# Patient Record
Sex: Female | Born: 1975 | Race: White | Hispanic: No | Marital: Married | Smoking: Never smoker
Health system: Southern US, Community
[De-identification: ages and names within clinical notes are randomized; demographics above are authoritative.]

## PROBLEM LIST (undated history)

## (undated) DIAGNOSIS — E559 Vitamin D deficiency, unspecified: Secondary | ICD-10-CM

## (undated) HISTORY — DX: Vitamin D deficiency, unspecified: E55.9

## (undated) HISTORY — PX: NO PAST SURGERIES: SHX2092

---

## 1997-11-20 ENCOUNTER — Emergency Department (HOSPITAL_COMMUNITY): Admission: EM | Admit: 1997-11-20 | Discharge: 1997-11-20 | Payer: Self-pay | Admitting: Emergency Medicine

## 1997-12-21 ENCOUNTER — Other Ambulatory Visit: Admission: RE | Admit: 1997-12-21 | Discharge: 1997-12-21 | Payer: Self-pay | Admitting: Obstetrics and Gynecology

## 2004-01-05 ENCOUNTER — Other Ambulatory Visit: Admission: RE | Admit: 2004-01-05 | Discharge: 2004-01-05 | Payer: Self-pay | Admitting: Obstetrics and Gynecology

## 2005-01-04 ENCOUNTER — Other Ambulatory Visit: Admission: RE | Admit: 2005-01-04 | Discharge: 2005-01-04 | Payer: Self-pay | Admitting: Obstetrics and Gynecology

## 2005-11-20 ENCOUNTER — Other Ambulatory Visit: Admission: RE | Admit: 2005-11-20 | Discharge: 2005-11-20 | Payer: Self-pay | Admitting: Obstetrics and Gynecology

## 2005-12-17 ENCOUNTER — Encounter: Admission: RE | Admit: 2005-12-17 | Discharge: 2006-01-03 | Payer: Self-pay | Admitting: Internal Medicine

## 2006-06-13 ENCOUNTER — Inpatient Hospital Stay (HOSPITAL_COMMUNITY): Admission: AD | Admit: 2006-06-13 | Discharge: 2006-06-14 | Payer: Self-pay | Admitting: Obstetrics and Gynecology

## 2006-06-14 ENCOUNTER — Inpatient Hospital Stay (HOSPITAL_COMMUNITY): Admission: AD | Admit: 2006-06-14 | Discharge: 2006-06-16 | Payer: Self-pay | Admitting: Obstetrics and Gynecology

## 2007-07-24 ENCOUNTER — Other Ambulatory Visit: Admission: RE | Admit: 2007-07-24 | Discharge: 2007-07-24 | Payer: Self-pay | Admitting: Nurse Practitioner

## 2008-06-23 ENCOUNTER — Inpatient Hospital Stay (HOSPITAL_COMMUNITY): Admission: AD | Admit: 2008-06-23 | Discharge: 2008-06-25 | Payer: Self-pay | Admitting: Obstetrics and Gynecology

## 2008-11-17 ENCOUNTER — Other Ambulatory Visit: Admission: RE | Admit: 2008-11-17 | Discharge: 2008-11-17 | Payer: Self-pay | Admitting: Obstetrics and Gynecology

## 2010-07-20 LAB — CBC
HCT: 31.4 % — ABNORMAL LOW (ref 36.0–46.0)
Hemoglobin: 10.9 g/dL — ABNORMAL LOW (ref 12.0–15.0)
MCHC: 34.1 g/dL (ref 30.0–36.0)
MCV: 89.7 fL (ref 78.0–100.0)
Platelets: 220 10*3/uL (ref 150–400)
RBC: 3.44 MIL/uL — ABNORMAL LOW (ref 3.87–5.11)
WBC: 12.2 10*3/uL — ABNORMAL HIGH (ref 4.0–10.5)
WBC: 12.4 10*3/uL — ABNORMAL HIGH (ref 4.0–10.5)

## 2010-07-20 LAB — URIC ACID: Uric Acid, Serum: 3.2 mg/dL (ref 2.4–7.0)

## 2010-07-20 LAB — COMPREHENSIVE METABOLIC PANEL
AST: 21 U/L (ref 0–37)
Albumin: 2.9 g/dL — ABNORMAL LOW (ref 3.5–5.2)
Calcium: 8.9 mg/dL (ref 8.4–10.5)
Chloride: 105 mEq/L (ref 96–112)
Creatinine, Ser: 0.65 mg/dL (ref 0.4–1.2)
GFR calc Af Amer: >60 mL/min (ref >60)

## 2013-02-23 ENCOUNTER — Encounter: Payer: Self-pay | Admitting: Internal Medicine

## 2013-02-24 ENCOUNTER — Ambulatory Visit: Payer: Self-pay | Admitting: Internal Medicine

## 2013-02-24 ENCOUNTER — Encounter: Payer: Self-pay | Admitting: Internal Medicine

## 2013-02-24 VITALS — BP 116/68 | HR 76 | Temp 99.1°F | Resp 16 | Ht 68.0 in | Wt 162.2 lb

## 2013-02-24 DIAGNOSIS — J069 Acute upper respiratory infection, unspecified: Secondary | ICD-10-CM

## 2013-02-24 MED ORDER — PREDNISONE 20 MG PO TABS: 20.0000 mg | ORAL_TABLET | ORAL | Status: DC

## 2013-02-24 NOTE — Progress Notes (Signed)
Patient ID: Abigail Hicks, female   DOB: Jun 08, 1975, 37 y.o.   MRN: [id number]  HPI  TYhis very nice 37 yo MWF  presents with URI symptoms for about 1 week. . Patient has has had fever of about 1 degree. She reports some fullness in her ears and head and a sensationof feeling 'spaced out'. Her Sx's are skewed by the recent transition from citalopram to wellbutrin (because of weight gain) with consideration of possible serotonin withdrawal sx's. She denies any nausea, vertigo or neurologic Sx's other than a mild headache.    Past Medical History  Diagnosis Date  . Vitamin D deficiency     Filed Vitals:   02/24/13 1437  BP: 116/68  Pulse: 76  Temp: 99.1 F (37.3 C)  Resp: 16    General Appearance: Patient in no distress.  Eyes: PERRLA, EOMS, conjunctiva pink without drainage or nodules.  Sinuses: Nontender maxillary and frontal sinus tenderness.  ENT/Mouth: EACs patent, no swelling, erythema, drainage. TMs sl retracted. Nasal mucosa/turbinates nl. Dentition- good hygiene. Post pharynx without erythema, swelling or exudate. Tonsils without swelling, erythema or exudate.  Neck: Supple. No B,N,JVD.  Respiratory: BS equal and clear.  Carviovascular: RRR, no murmurs, rubs or gallops.  Assessment and plan  Visit Diagnoses   Acute upper respiratory infections of unspecified site    -  Primary    Relevant Medications       predniSONE (DELTASONE) tablet - recommended restart citalopram tim net OV scheduledfor about 2 weeks

## 2013-02-24 NOTE — Patient Instructions (Signed)
  Restart your citalopram 40 mg daily for 1 week and then if your dizziness is better, try cutting your citalopram back to 1/2 tablet daily    Dizziness Dizziness is a common problem. It is a feeling of unsteadiness or lightheadedness. You may feel like you are about to faint. Dizziness can lead to injury if you stumble or fall. A person of any age group can suffer from dizziness, but dizziness is more common in older adults. CAUSES  Dizziness can be caused by many different things, including:  Middle ear problems.  Standing for too long.  Infections.  An allergic reaction.  Aging.  An emotional response to something, such as the sight of blood.  Side effects of medicines.  Fatigue.  Problems with circulation or blood pressure.  Excess use of alcohol, medicines, or illegal drug use.  Breathing too fast (hyperventilation).  An arrhythmia or problems with your heart rhythm.  Low red blood cell count (anemia).  Pregnancy.  Vomiting, diarrhea, fever, or other illnesses that cause dehydration.  Diseases or conditions such as Parkinson's disease, high blood pressure (hypertension), diabetes, and thyroid problems.  Exposure to extreme heat. DIAGNOSIS  To find the cause of your dizziness, your caregiver may do a physical exam, lab tests, radiologic imaging scans, or an electrocardiography test (ECG).  TREATMENT  Treatment of dizziness depends on the cause of your symptoms and can vary greatly. HOME CARE INSTRUCTIONS   Drink enough fluids to keep your urine clear or pale yellow. This is especially important in very hot weather. In the elderly, it is also important in cold weather.  If your dizziness is caused by medicines, take them exactly as directed. When taking blood pressure medicines, it is especially important to get up slowly.  Rise slowly from chairs and steady yourself until you feel okay.  In the morning, first sit up on the side of the bed. When this seems  okay, stand slowly while holding onto something until you know your balance is fine.  If you need to stand in one place for a long time, be sure to move your legs often. Tighten and relax the muscles in your legs while standing.  If dizziness continues to be a problem, have someone stay with you for a day or two. Do this until you feel you are well enough to stay alone. Have the person call your caregiver if he or she notices changes in you that are concerning.  Do not drive or use heavy machinery if you feel dizzy.  Do not drink alcohol. SEEK IMMEDIATE MEDICAL CARE IF:   Your dizziness or lightheadedness gets worse.  You feel nauseous or vomit.  You develop problems with talking, walking, weakness, or using your arms, hands, or legs.  You are not thinking clearly or you have difficulty forming sentences. It may take a friend or family member to determine if your thinking is normal.  You develop chest pain, abdominal pain, shortness of breath, or sweating.  Your vision changes.  You notice any bleeding.  You have side effects from medicine that seems to be getting worse rather than better. MAKE SURE YOU:   Understand these instructions.  Will watch your condition.  Will get help right away if you are not doing well or get worse. Document Released: 09/19/2000 Document Revised: 06/18/2011 Document Reviewed: 10/13/2010 [facility] Patient Information 2014 [place], [place].

## 2013-02-26 ENCOUNTER — Telehealth: Payer: Self-pay | Admitting: Internal Medicine

## 2013-02-26 ENCOUNTER — Other Ambulatory Visit: Payer: Self-pay | Admitting: Internal Medicine

## 2013-02-26 DIAGNOSIS — F411 Generalized anxiety disorder: Secondary | ICD-10-CM

## 2013-02-26 MED ORDER — CITALOPRAM HYDROBROMIDE 10 MG PO TABS: 10.0000 mg | ORAL_TABLET | Freq: Every day | ORAL | Status: DC

## 2013-02-26 NOTE — Telephone Encounter (Signed)
Pt called said you told her at office visit to continue the Citalapram 10mg  QD. Her rx expires and needs a new one for just 10 days. Please advise  Pharm: [facility]  Thanks [place]

## 2013-03-12 ENCOUNTER — Ambulatory Visit: Payer: Self-pay | Admitting: Internal Medicine

## 2013-04-08 ENCOUNTER — Ambulatory Visit: Payer: Self-pay | Admitting: Internal Medicine

## 2013-10-08 ENCOUNTER — Encounter: Payer: Self-pay | Admitting: Physician Assistant

## 2013-10-08 ENCOUNTER — Ambulatory Visit (INDEPENDENT_AMBULATORY_CARE_PROVIDER_SITE_OTHER): Payer: BC Managed Care – PPO | Admitting: Physician Assistant

## 2013-10-08 VITALS — BP 100/62 | HR 72 | Temp 98.1°F | Resp 16 | Wt 159.0 lb

## 2013-10-08 DIAGNOSIS — L259 Unspecified contact dermatitis, unspecified cause: Secondary | ICD-10-CM

## 2013-10-08 DIAGNOSIS — L309 Dermatitis, unspecified: Secondary | ICD-10-CM

## 2013-10-08 MED ORDER — PREDNISONE 20 MG PO TABS: ORAL_TABLET | ORAL | Status: DC

## 2013-10-08 MED ORDER — DEXAMETHASONE SODIUM PHOSPHATE 10 MG/ML IJ SOLN
10.0000 mg | Freq: Once | INTRAMUSCULAR | Status: AC
  Administered 2013-10-08: 10 mg via INTRAMUSCULAR

## 2013-10-08 NOTE — Progress Notes (Signed)
   Subjective:    Patient ID: Abigail Hicks, female    DOB: Oct 07, 1975, 38 y.o.   MRN: [id number]  HPI 38 y.o. with history of sensitive skin went on recent trip to [place], states second day in [place] on right lower leg started to have small papules but then it spread to bilateral legs. Has been putting hydrocortisone on it and has been doing benadryl. Kids and husband also have virus, she denies sore throat, fever, chills, SOB, CP. She is off the citalopram/wellbutrin.    Review of Systems  Constitutional: Negative.   HENT: Negative.   Respiratory: Negative.   Cardiovascular: Negative.   Gastrointestinal: Negative.   Genitourinary: Negative.   Musculoskeletal: Negative.   Skin: Positive for rash (on bilateral legs).  Neurological: Negative.        Objective:   Physical Exam  Constitutional: She is oriented to person, place, and time. She appears well-developed and well-nourished.  HENT:  Head: Normocephalic and atraumatic.  Mouth/Throat: Oropharynx is clear and moist. No oropharyngeal exudate.  Eyes: Conjunctivae are normal. Pupils are equal, round, and reactive to light.  Neck: Normal range of motion. Neck supple.  Cardiovascular: Normal rate and regular rhythm.   Pulmonary/Chest: Effort normal and breath sounds normal.  Abdominal: Soft. Bowel sounds are normal. There is no tenderness.  Musculoskeletal: Normal range of motion.  Lymphadenopathy:    She has no cervical adenopathy.  Neurological: She is alert and oriented to person, place, and time.  Skin: Skin is warm and dry. Rash (fine papular rash on bilateral legs) noted.      Assessment & Plan:  1. Dermatitis Vs Viral exantham Continue benadryl at night - dexamethasone (DECADRON) injection 10 mg; Inject 1 mL (10 mg total) into the muscle once. - predniSONE (DELTASONE) 20 MG tablet; Take one pill two times daily for 3 days, take one pill daily for 4 days.  Dispense: 10 tablet; Refill: 1

## 2013-10-08 NOTE — Patient Instructions (Signed)

## 2014-01-17 ENCOUNTER — Encounter: Payer: Self-pay | Admitting: Internal Medicine

## 2014-01-17 DIAGNOSIS — E559 Vitamin D deficiency, unspecified: Secondary | ICD-10-CM | POA: Insufficient documentation

## 2014-01-17 DIAGNOSIS — Z79899 Other long term (current) drug therapy: Secondary | ICD-10-CM | POA: Insufficient documentation

## 2014-01-17 DIAGNOSIS — R7309 Other abnormal glucose: Secondary | ICD-10-CM | POA: Insufficient documentation

## 2014-01-17 NOTE — Progress Notes (Signed)
Patient ID: Abigail Hicks, female   DOB: 11/27/1975, 38 y.o.   MRN: [id number]   Annual Screening Comprehensive Examination   This very nice 38 y.o.MWF presents for complete physical.  Patient has no major health issues.    Finally, patient has history of Vitamin D Deficiency of 33 in 2011 and last vitamin D was 108 in Oct 2014 and patient was advised to taper her dose of Vit D.   Current Outpatient Prescriptions on File Prior to Visit  Medication Sig Dispense Refill  . VITAMIN D Take 8,000 Units  daily.      . Multiple Vitamin (MULTIVITAMIN) tablet Take 1 tablet by mouth daily.       Allergies  Allergen Reactions  . Pneumovax [Pneumococcal Polysaccharide Vaccine]    Past Medical History  Diagnosis Date  . Vitamin D deficiency    Immunization History  Administered Date(s) Administered  . Influenza Split 01/18/2014  . Influenza-Unspecified 01/12/2013  . PPD Test 01/18/2014  . Pneumococcal Polysaccharide-23 01/03/2011  . Td 12/28/2009   History reviewed. No pertinent past surgical history.  Family History  Problem Relation Age of Onset  . Arthritis Mother     Rhumatoid  . Multiple myeloma Father    History   Social History  . Marital Status: Married    Spouse Name: N/A    Number of Children:  Son 46 yo & Daughter 83 yo.   Occupational History  . 3rd grade teacher   Social History Main Topics  . Smoking status: Never Smoker   . Smokeless tobacco: Not on file  . Alcohol Use: 3.5 oz/week    7 drink(s) per week  . Drug Use: No  . Sexual Activity: Active     ROS Constitutional: Denies fever, chills, weight loss/gain, headaches, insomnia, fatigue, night sweats, and change in appetite. Eyes: Denies redness, blurred vision, diplopia, discharge, itchy, watery eyes.  ENT: Denies discharge, congestion, post nasal drip, epistaxis, sore throat, earache, hearing loss, dental pain, Tinnitus, Vertigo, Sinus pain, snoring.  Cardio: Denies chest pain, palpitations, irregular  heartbeat, syncope, dyspnea, diaphoresis, orthopnea, PND, claudication, edema Respiratory: denies cough, dyspnea, DOE, pleurisy, hoarseness, laryngitis, wheezing.  Gastrointestinal: Denies dysphagia, heartburn, reflux, water brash, pain, cramps, nausea, vomiting, bloating, diarrhea, constipation, hematemesis, melena, hematochezia, jaundice, hemorrhoids Genitourinary: Denies dysuria, frequency, urgency, nocturia, hesitancy, discharge, hematuria, flank pain Breast: Breast lumps, nipple discharge, bleeding.  Musculoskeletal: Denies arthralgia, myalgia, stiffness, Jt. Swelling, pain, limp, and strain/sprain. Skin: Denies puritis, rash, hives, warts, acne, eczema, changing in skin lesion Neuro: Weakness, tremor, incoordination, spasms, paresthesia, pain Psychiatric: Denies confusion, memory loss, sensory loss. Endocrine: Denies change in weight, skin, hair change, nocturia, and paresthesia, diabetic polys, visual blurring, hyper /hypo glycemic episodes.  Heme/Lymph: No excessive bleeding, bruising, enlarged lymph nodes.  Physical Exam  BP 112/74  Pulse 84  Temp 98.4 F Resp 16  Ht $R'5\' 8"'FY$    Wt 165 lb 3.2 oz   BMI 25.12   General Appearance: Well nourished and in no apparent distress. Eyes: PERRLA, EOMs, conjunctiva no swelling or erythema, normal fundi and vessels. Sinuses: No frontal/maxillary tenderness ENT/Mouth: EACs patent / TMs  nl. Nares clear without erythema, swelling, mucoid exudates. Oral hygiene is good. No erythema, swelling, or exudate. Tongue normal, non-obstructing. Tonsils not swollen or erythematous. Hearing normal.  Neck: Supple, thyroid normal. No bruits, nodes or JVD. Respiratory: Respiratory effort normal.  BS equal and clear bilateral without rales, rhonci, wheezing or stridor. Cardio: Heart sounds are normal with regular rate and rhythm and  no murmurs, rubs or gallops. Peripheral pulses are normal and equal bilaterally without edema. No aortic or femoral bruits. Chest:  symmetric with normal excursions and percussion. Breasts: Done recent by GYN / PAP & MGM also done by GYN Abdomen: Flat, soft, with bowl sounds. Nontender, no guarding, rebound, hernias, masses, or organomegaly.  Lymphatics: Non tender without lymphadenopathy.  Musculoskeletal: Full ROM all peripheral extremities, joint stability, 5/5 strength, and normal gait. Skin: Warm and dry without rashes, lesions, cyanosis, clubbing or  ecchymosis.  Neuro: Cranial nerves intact, reflexes equal bilaterally. Normal muscle tone, no cerebellar symptoms. Sensation intact.  Pysch: Awake and oriented X 3, normal affect, Insight and Judgment appropriate.   Assessment and Plan  1. Annual Screening Examination 2.  Vitamin D Deficiency  Continue prudent diet as discussed, weight control, regular exercise, and medications. Routine screening labs and tests as requested with regular follow-up as recommended.

## 2014-01-17 NOTE — Patient Instructions (Signed)
Recommend the book "The END of DIETING" by Dr Baker Janus   and the book "The END of DIABETES " by Dr Excell Seltzer  At [email or web address] - get book & Audio CD's      Being diabetic has a  300% increased risk for heart attack, stroke, cancer, and alzheimer- type vascular dementia. It is very important that you work harder with diet by avoiding all foods that are white except chicken & fish. Avoid white rice (brown & wild rice is OK), white potatoes (sweetpotatoes in moderation is OK), White bread or wheat bread or anything made out of white flour like bagels, donuts, rolls, buns, biscuits, cakes, pastries, cookies, pizza crust, and pasta (made from white flour & egg whites) - vegetarian pasta or spinach or wheat pasta is OK. Multigrain breads like Arnold's or [employer], or multigrain sandwich thins or flatbreads.  Diet, exercise and weight loss can reverse and cure diabetes in the early stages.  Diet, exercise and weight loss is very important in the control and prevention of complications of diabetes which affects every system in your body, ie. Brain - dementia/stroke, eyes - glaucoma/blindness, heart - heart attack/heart failure, kidneys - dialysis, stomach - gastric paralysis, intestines - malabsorption, nerves - severe painful neuritis, circulation - gangrene & loss of a leg(s), and finally cancer and Alzheimers.    I recommend avoid fried & greasy foods,  sweets/candy, white rice (brown or wild rice or Quinoa is OK), white potatoes (sweet potatoes are OK) - anything made from white flour - bagels, doughnuts, rolls, buns, biscuits,white and wheat breads, pizza crust and traditional pasta made of white flour & egg white(vegetarian pasta or spinach or wheat pasta is OK).  Multi-grain bread is OK - like multi-grain flat bread or sandwich thins. Avoid alcohol in excess. Exercise is also important.    Eat all the vegetables you want - avoid meat, especially red meat and dairy - especially cheese.  Cheese  is the most concentrated form of trans-fats which is the worst thing to clog up our arteries. Veggie cheese is OK which can be found in the fresh produce section at [employer] or [employer] or [employer]  Preventive Care for Adults A healthy lifestyle and preventive care can promote health and wellness. Preventive health guidelines for women include the following key practices.  A routine yearly physical is a good way to check with your health care provider about your health and preventive screening. It is a chance to share any concerns and updates on your health and to receive a thorough exam.  Visit your dentist for a routine exam and preventive care every 6 months. Brush your teeth twice a day and floss once a day. Good oral hygiene prevents tooth decay and gum disease.  The frequency of eye exams is based on your age, health, family medical history, use of contact lenses, and other factors. Follow your health care provider's recommendations for frequency of eye exams.  Eat a healthy diet. Foods like vegetables, fruits, whole grains, low-fat dairy products, and lean protein foods contain the nutrients you need without too many calories. Decrease your intake of foods high in solid fats, added sugars, and salt. Eat the right amount of calories for you.Get information about a proper diet from your health care provider, if necessary.  Regular physical exercise is one of the most important things you can do for your health. Most adults should get at least 150 minutes of moderate-intensity exercise (any activity that increases  your heart rate and causes you to sweat) each week. In addition, most adults need muscle-strengthening exercises on 2 or more days a week.  Maintain a healthy weight. The body mass index (BMI) is a screening tool to identify possible weight problems. It provides an estimate of body fat based on height and weight. Your health care provider can find your BMI and can help you  achieve or maintain a healthy weight.For adults 20 years and older:  A BMI below 18.5 is considered underweight.  A BMI of 18.5 to 24.9 is normal.  A BMI of 25 to 29.9 is considered overweight.  A BMI of 30 and above is considered obese.  Maintain normal blood lipids and cholesterol levels by exercising and minimizing your intake of saturated fat. Eat a balanced diet with plenty of fruit and vegetables.Ongoing high lipid and cholesterol levels should be treated with medicines if diet and exercise are not working.  If you smoke, find out from your health care provider how to quit. If you do not use tobacco, do not start.  Lung cancer screening is recommended for adults aged 62-80 years who are at high risk for developing lung cancer because of a history of smoking. A yearly low-dose CT scan of the lungs is recommended for people who have at least a 30-pack-year history of smoking and are a current smoker or have quit within the past 15 years. A pack year of smoking is smoking an average of 1 pack of cigarettes a day for 1 year (for example: 1 pack a day for 30 years or 2 packs a day for 15 years). Yearly screening should continue until the smoker has stopped smoking for at least 15 years. Yearly screening should be stopped for people who develop a health problem that would prevent them from having lung cancer treatment.  If you are pregnant, do not drink alcohol. If you are breastfeeding, be very cautious about drinking alcohol. If you are not pregnant and choose to drink alcohol, do not have more than 1 drink per day. One drink is considered to be 12 ounces (355 mL) of beer, 5 ounces (148 mL) of wine, or 1.5 ounces (44 mL) of liquor.  Avoid use of street drugs. Do not share needles with anyone. Ask for help if you need support or instructions about stopping the use of drugs.  High blood pressure causes heart disease and increases the risk of stroke.  Ongoing high blood pressure should be  treated with medicines if weight loss and exercise do not work.  IAsk your health care provider if you should take aspirin to prevent strokes.  Diabetes screening involves taking a blood sample to check your fasting blood sugar level.  Testing should be considered at a younger age or be carried out more frequently if you are overweight and have at least 1 risk factor for diabetes.    Breast cancer screening is essential preventive care for women. You should practice "breast self-awareness." This means understanding the normal appearance and feel of your breasts and may include breast self-examination. Any changes detected, no matter how small, should be reported to a health care provider. Women in their 77s and 30s should have a clinical breast exam (CBE) by a health care provider as part of a regular health exam every 1 to 3 years. After age 72, women should have a CBE every year. Starting at age 56, women should consider having a mammogram (breast X-ray test) every year. Women who have  a family history of breast cancer should talk to their health care provider about genetic screening. Women at a high risk of breast cancer should talk to their health care providers about having an MRI and a mammogram every year.  Breast cancer gene (BRCA)-related cancer risk assessment is recommended for women who have family members with BRCA-related cancers. BRCA-related cancers include breast, ovarian, tubal, and peritoneal cancers. Having family members with these cancers may be associated with an increased risk for harmful changes (mutations) in the breast cancer genes BRCA1 and BRCA2. Results of the assessment will determine the need for genetic counseling and BRCA1 and BRCA2 testing.  Routine pelvic exams to screen for cancer are no longer recommended for nonpregnant women who are considered low risk for cancer of the pelvic organs (ovaries, uterus, and vagina) and who do not have symptoms. Ask your health care  provider if a screening pelvic exam is right for you.  If you have had past treatment for cervical cancer or a condition that could lead to cancer, you need Pap tests and screening for cancer for at least 20 years after your treatment. If Pap tests have been discontinued, your risk factors (such as having a new sexual partner) need to be reassessed to determine if screening should be resumed. Some women have medical problems that increase the chance of getting cervical cancer. In these cases, your health care provider may recommend more frequent screening and Pap tests.  The HPV test is an additional test that may be used for cervical cancer screening. The HPV test looks for the virus that can cause the cell changes on the cervix. The cells collected during the Pap test can be tested for HPV. The HPV test could be used to screen women aged 48 years and older, and should be used in women of any age who have unclear Pap test results. After the age of 94, women should have HPV testing at the same frequency as a Pap test.    Colorectal cancer can be detected and often prevented. Most routine colorectal cancer screening begins at the age of 84 years and continues through age 70 years. However, your health care provider may recommend screening at an earlier age if you have risk factors for colon cancer. On a yearly basis, your health care provider may provide home test kits to check for hidden blood in the stool. Use of a small camera at the end of a tube, to directly examine the colon (sigmoidoscopy or colonoscopy), can detect the earliest forms of colorectal cancer. Talk to your health care provider about this at age 18, when routine screening begins. Direct exam of the colon should be repeated every 5-10 years through age 74 years, unless early forms of pre-cancerous polyps or small growths are found.  Hepatitis C blood testing is recommended for all people born from [birth year] through 1966 and any individual with  known risks for hepatitis C.  Practice safe sex. Use condoms and avoid high-risk sexual practices to reduce the spread of sexually transmitted infections (STIs). STIs include gonorrhea, chlamydia, syphilis, trichomonas, herpes, HPV, and human immunodeficiency virus (HIV). Herpes, HIV, and HPV are viral illnesses that have no cure. They can result in disability, cancer, and death.  You should be screened for sexually transmitted illnesses (STIs) including gonorrhea and chlamydia if:  You are sexually active and are younger than 24 years.  You are older than 24 years and your health care provider tells you that you are at risk  for this type of infection.  Your sexual activity has changed since you were last screened and you are at an increased risk for chlamydia or gonorrhea. Ask your health care provider if you are at risk.    Osteoporosis is a disease in which the bones lose minerals and strength with aging. This can result in serious bone fractures or breaks. The risk of osteoporosis can be identified using a bone density scan. Women ages 58 years and over and women at risk for fractures or osteoporosis should discuss screening with their health care providers. Ask your health care provider whether you should take a calcium supplement or vitamin D to reduce the rate of osteoporosis.  Menopause can be associated with physical symptoms and risks. Hormone replacement therapy is available to decrease symptoms and risks. You should talk to your health care provider about whether hormone replacement therapy is right for you.  Use sunscreen. Apply sunscreen liberally and repeatedly throughout the day. You should seek shade when your shadow is shorter than you. Protect yourself by wearing long sleeves, pants, a wide-brimmed hat, and sunglasses year round, whenever you are outdoors.  Once a month, do a whole body skin exam, using a mirror to look at the skin on your back. Tell your health care provider  of new moles, moles that have irregular borders, moles that are larger than a pencil eraser, or moles that have changed in shape or color.  Stay current with required vaccines (immunizations).  Influenza vaccine. All adults should be immunized every year.   An adult who has not previously received Tdap or who does not know her vaccine status should receive 1 dose of Tdap. This initial dose should be followed by tetanus and diphtheria toxoids (Td) booster doses every 10 years. Adults with an unknown or incomplete history of completing a 3-dose immunization series with Td-containing vaccines should begin or complete a primary immunization series including a Tdap dose. Adults should receive a Td booster every 10 years.  Zoster vaccine. One dose is recommended for adults aged 24 years or older unless certain conditions are present.  Measles, mumps, and rubella (MMR) vaccine. Adults born in [birth year] or later should have 1 or more doses of MMR vaccine unless there is a contraindication to the vaccine or there is laboratory evidence of immunity to each of the three diseases. A routine second dose of MMR vaccine should be obtained at least 28 days after the first dose for students attending postsecondary schools, health care workers, or international travelers. People who received inactivated measles vaccine or an unknown type of measles vaccine during 1963-1967 should receive 2 doses of MMR vaccine. People who received inactivated mumps vaccine or an unknown type of mumps vaccine before 1979 and are at high risk for mumps infection should consider immunization with 2 doses of MMR vaccine. For females of childbearing age, rubella immunity should be determined. If there is no evidence of immunity, females who are not pregnant should be vaccinated. If there is no evidence of immunity, females who are pregnant should delay immunization until after pregnancy. Unvaccinated health care workers born before [birth year] who lack  laboratory evidence of measles, mumps, or rubella immunity or laboratory confirmation of disease should consider measles and mumps immunization with 2 doses of MMR vaccine or rubella immunization with 1 dose of MMR vaccine.    Pneumococcal 13-valent conjugate (PCV13) vaccine. When indicated, a person who is uncertain of her immunization history and has no record of immunization should receive  the PCV13 vaccine. An adult aged 38 years or older who has certain medical conditions and has not been previously immunized should receive 1 dose of PCV13 vaccine. This PCV13 should be followed with a dose of pneumococcal polysaccharide (PPSV23) vaccine. The PPSV23 vaccine dose should be obtained at least 8 weeks after the dose of PCV13 vaccine. An adult aged 46 years or older who has certain medical conditions and previously received 1 or more doses of PPSV23 vaccine should receive 1 dose of PCV13. The PCV13 vaccine dose should be obtained 1 or more years after the last PPSV23 vaccine dose.    Pneumococcal polysaccharide (PPSV23) vaccine. When PCV13 is also indicated, PCV13 should be obtained first. All adults aged 55 years and older should be immunized. An adult younger than age 69 years who has certain medical conditions should be immunized. Any person who resides in a nursing home or long-term care facility should be immunized. An adult smoker should be immunized. People with an immunocompromised condition and certain other conditions should receive both PCV13 and PPSV23 vaccines. People with human immunodeficiency virus (HIV) infection should be immunized as soon as possible after diagnosis. Immunization during chemotherapy or radiation therapy should be avoided. Routine use of PPSV23 vaccine is not recommended for American Indians, [place] Natives, or people younger than 65 years unless there are medical conditions that require PPSV23 vaccine. When indicated, people who have unknown immunization and have no record  of immunization should receive PPSV23 vaccine. One-time revaccination 5 years after the first dose of PPSV23 is recommended for people aged 19-64 years who have chronic kidney failure, nephrotic syndrome, asplenia, or immunocompromised conditions. People who received 1-2 doses of PPSV23 before age 72 years should receive another dose of PPSV23 vaccine at age 69 years or later if at least 5 years have passed since the previous dose. Doses of PPSV23 are not needed for people immunized with PPSV23 at or after age 14 years.    Hepatitis A vaccine. Adults who wish to be protected from this disease, have certain high-risk conditions, work with hepatitis A-infected animals, work in hepatitis A research labs, or travel to or work in countries with a high rate of hepatitis A should be immunized. Adults who were previously unvaccinated and who anticipate close contact with an international adoptee during the first 60 days after arrival in the [place] from a country with a high rate of hepatitis A should be immunized.  Hepatitis B vaccine. Adults who wish to be protected from this disease, have certain high-risk conditions, may be exposed to blood or other infectious body fluids, are household contacts or sex partners of hepatitis B positive people, are clients or workers in certain care facilities, or travel to or work in countries with a high rate of hepatitis B should be immunized. Preventive Services / Frequency  Ages 79 to 28 years  Blood pressure check.  Lipid and cholesterol check.  Clinical breast exam.  BRCA-related cancer risk assessment.** / For women who have family members with a BRCA-related cancer (breast, ovarian, tubal, or peritoneal cancers).  Pap test.** / Every 2 years from ages 39 through 37. Every 3 years starting at age 5 through age 31 or 35 with a history of 3 consecutive normal Pap tests.  HPV screening.** / Every 3 years from ages 26 through ages 73 to 26 with a history of  3 consecutive normal Pap tests.  Hepatitis C blood test.** / For any individual with known risks for hepatitis C.  Skin self-exam. / Monthly.  Influenza vaccine. / Every year.  Tetanus, diphtheria, and acellular pertussis (Tdap, Td) vaccine.** / Consult your health care provider.  1 dose of Td every 10 years.  Measles, mumps, rubella (MMR) vaccine.** / You need at least 1 dose of MMR if you were born in 1957 or later. You may also need a 2nd dose.  Pneumococcal 13-valent conjugate (PCV13) vaccine.** / Consult your health care provider.  Pneumococcal polysaccharide (PPSV23) vaccine.** / 1 to 2 doses if you smoke cigarettes or if you have certain conditions.  Hepatitis A vaccine.** / Consult your health care provider.  Hepatitis B vaccine.** / Consult your health care provider.   Ages 68 to 60 years  Blood pressure check.** / Every 1 to 2 years.  Lipid and cholesterol check.** / Every 5 years beginning at age 46 years.  Lung cancer screening. / Every year if you are aged 3-80 years and have a 30-pack-year history of smoking and currently smoke or have quit within the past 15 years. Yearly screening is stopped once you have quit smoking for at least 15 years or develop a health problem that would prevent you from having lung cancer treatment.  Clinical breast exam.** / Every year after age 62 years.  BRCA-related cancer risk assessment.** / For women who have family members with a BRCA-related cancer (breast, ovarian, tubal, or peritoneal cancers).  Mammogram.** / Every year beginning at age 21 years and continuing for as long as you are in good health. Consult with your health care provider.  Pap test.** / Every 3 years starting at age 51 years through age 26 or 27 years with a history of 3 consecutive normal Pap tests.  HPV screening.** / Every 3 years from ages 55 years through ages 44 to 52 years with a history of 3 consecutive normal Pap tests.  Fecal occult blood test  (FOBT) of stool. / Every year beginning at age 69 years and continuing until age 30 years.  Flexible sigmoidoscopy or colonoscopy.** / Every 5 years for a flexible sigmoidoscopy or every 10 years for a colonoscopy beginning at age 88 years and continuing until age 53 years.  Hepatitis C blood test.** / For all people born from [birth year] through 1966 and any individual with known risks for hepatitis C.  Skin self-exam. / Monthly.  Influenza vaccine. / Every year.  Tetanus, diphtheria, and acellular pertussis (Tdap/Td) vaccine.** / Consult your health care provider. 1 dose of Td every 10 years.  Varicella vaccine.** / Consult your health care provider. Pregnant females who do not have evidence of immunity should receive the first dose after pregnancy.  Zoster vaccine.** / 1 dose for adults aged 62 years or older.  Measles, mumps, rubella (MMR) vaccine.** / You need at least 1 dose of MMR if you were born in 1958 or later. You may also need a 2nd dose. For females of childbearing age, rubella immunity should be determined. If there is no evidence of immunity, females who are not pregnant should be vaccinated. If there is no evidence of immunity, females who are pregnant should delay immunization until after pregnancy.  Pneumococcal 13-valent conjugate (PCV13) vaccine.** / Consult your health care provider.  Pneumococcal polysaccharide (PPSV23) vaccine.** / 1 to 2 doses if you smoke cigarettes or if you have certain conditions.  Hepatitis A vaccine.** / Consult your health care provider.  Hepatitis B vaccine.** / Consult your health care provider.

## 2014-01-18 ENCOUNTER — Ambulatory Visit (INDEPENDENT_AMBULATORY_CARE_PROVIDER_SITE_OTHER): Payer: BC Managed Care – PPO | Admitting: Internal Medicine

## 2014-01-18 VITALS — BP 112/74 | HR 84 | Temp 98.4°F | Resp 16 | Ht 68.0 in | Wt 165.2 lb

## 2014-01-18 DIAGNOSIS — R945 Abnormal results of liver function studies: Secondary | ICD-10-CM

## 2014-01-18 DIAGNOSIS — R6889 Other general symptoms and signs: Secondary | ICD-10-CM

## 2014-01-18 DIAGNOSIS — Z0001 Encounter for general adult medical examination with abnormal findings: Secondary | ICD-10-CM

## 2014-01-18 DIAGNOSIS — R7309 Other abnormal glucose: Secondary | ICD-10-CM

## 2014-01-18 DIAGNOSIS — Z23 Encounter for immunization: Secondary | ICD-10-CM

## 2014-01-18 DIAGNOSIS — Z1212 Encounter for screening for malignant neoplasm of rectum: Secondary | ICD-10-CM

## 2014-01-18 DIAGNOSIS — R7989 Other specified abnormal findings of blood chemistry: Secondary | ICD-10-CM

## 2014-01-18 DIAGNOSIS — Z113 Encounter for screening for infections with a predominantly sexual mode of transmission: Secondary | ICD-10-CM

## 2014-01-18 DIAGNOSIS — Z111 Encounter for screening for respiratory tuberculosis: Secondary | ICD-10-CM

## 2014-01-18 DIAGNOSIS — E559 Vitamin D deficiency, unspecified: Secondary | ICD-10-CM

## 2014-01-18 LAB — CBC WITH DIFFERENTIAL/PLATELET
BASOS PCT: 0 % (ref 0–1)
Basophils Absolute: 0 10*3/uL (ref 0.0–0.1)
EOS ABS: 0.1 10*3/uL (ref 0.0–0.7)
EOS PCT: 1 % (ref 0–5)
HCT: 37.7 % (ref 36.0–46.0)
Hemoglobin: 12.9 g/dL (ref 12.0–15.0)
LYMPHS ABS: 2.2 10*3/uL (ref 0.7–4.0)
Lymphocytes Relative: 31 % (ref 12–46)
MCH: 29.3 pg (ref 26.0–34.0)
MCHC: 34.2 g/dL (ref 30.0–36.0)
MCV: 85.5 fL (ref 78.0–100.0)
MONOS PCT: 5 % (ref 3–12)
Monocytes Absolute: 0.4 10*3/uL (ref 0.1–1.0)
Neutro Abs: 4.5 10*3/uL (ref 1.7–7.7)
Neutrophils Relative %: 63 % (ref 43–77)
PLATELETS: 337 10*3/uL (ref 150–400)
RBC: 4.41 MIL/uL (ref 3.87–5.11)
RDW: 12.9 % (ref 11.5–15.5)
WBC: 7.2 10*3/uL (ref 4.0–10.5)

## 2014-01-19 LAB — HEPATIC FUNCTION PANEL
ALBUMIN: 4.3 g/dL (ref 3.5–5.2)
ALT: 8 U/L (ref 0–35)
AST: 14 U/L (ref 0–37)
Alkaline Phosphatase: 23 U/L — ABNORMAL LOW (ref 39–117)
Bilirubin, Direct: 0.1 mg/dL (ref 0.0–0.3)
Indirect Bilirubin: 0.4 mg/dL (ref 0.2–1.2)
Total Bilirubin: 0.5 mg/dL (ref 0.2–1.2)
Total Protein: 6.5 g/dL (ref 6.0–8.3)

## 2014-01-19 LAB — LIPID PANEL
Cholesterol: 134 mg/dL (ref 0–200)
HDL: 50 mg/dL (ref >39)
LDL Cholesterol: 44 mg/dL (ref 0–99)
Total CHOL/HDL Ratio: 2.7 Ratio
Triglycerides: 202 mg/dL — ABNORMAL HIGH (ref <150)
VLDL: 40 mg/dL (ref 0–40)

## 2014-01-19 LAB — BASIC METABOLIC PANEL WITH GFR
BUN: 12 mg/dL (ref 6–23)
CALCIUM: 9.2 mg/dL (ref 8.4–10.5)
CO2: 28 meq/L (ref 19–32)
CREATININE: 1.1 mg/dL (ref 0.50–1.10)
Chloride: 104 mEq/L (ref 96–112)
GFR, EST AFRICAN AMERICAN: 74 mL/min
GFR, Est Non African American: 64 mL/min
GLUCOSE: 111 mg/dL — AB (ref 70–99)
Potassium: 4.1 mEq/L (ref 3.5–5.3)
Sodium: 139 mEq/L (ref 135–145)

## 2014-01-19 LAB — URINALYSIS, MICROSCOPIC ONLY
Bacteria, UA: NONE SEEN
CRYSTALS: NONE SEEN
Casts: NONE SEEN
SQUAMOUS EPITHELIAL / LPF: NONE SEEN

## 2014-01-19 LAB — VITAMIN B12: VITAMIN B 12: 508 pg/mL (ref 211–911)

## 2014-01-19 LAB — IRON AND TIBC
%SAT: 37 % (ref 20–55)
IRON: 110 ug/dL (ref 42–145)
TIBC: 297 ug/dL (ref 250–470)
UIBC: 187 ug/dL (ref 125–400)

## 2014-01-19 LAB — INSULIN, FASTING: INSULIN FASTING, SERUM: 43.1 u[IU]/mL — AB (ref 2.0–19.6)

## 2014-01-19 LAB — MICROALBUMIN / CREATININE URINE RATIO
CREATININE, URINE: 114.8 mg/dL
MICROALB/CREAT RATIO: 2.6 mg/g (ref 0.0–30.0)
Microalb, Ur: 0.3 mg/dL (ref <2.0)

## 2014-01-19 LAB — HEMOGLOBIN A1C
HEMOGLOBIN A1C: 5.3 % (ref <5.7)
MEAN PLASMA GLUCOSE: 105 mg/dL (ref <117)

## 2014-01-19 LAB — VITAMIN D 25 HYDROXY (VIT D DEFICIENCY, FRACTURES): VIT D 25 HYDROXY: 111 ng/mL — AB (ref 30–89)

## 2014-01-19 LAB — MAGNESIUM: MAGNESIUM: 1.9 mg/dL (ref 1.5–2.5)

## 2014-01-19 LAB — TSH: TSH: 0.93 u[IU]/mL (ref 0.350–4.500)

## 2014-01-21 LAB — TB SKIN TEST
Induration: 0 mm
TB Skin Test: NEGATIVE

## 2014-02-11 ENCOUNTER — Telehealth: Payer: Self-pay | Admitting: *Deleted

## 2014-02-11 NOTE — Telephone Encounter (Signed)
Patient called and left detailed message with the front staff.  I received a written message stating concerns with head lice and requesting advice for treatment options.  Per Dr. Idell Pickles orders I called patient and offered advice on treating her house and surrounding environments as well as treating her head.  Dr. Melford Aase suggests she tries OTC Pronto Plus Shampoo and continue to treat the house with OTC RID.  Advised patient to call if no relief from symptoms after treatments.

## 2014-08-19 ENCOUNTER — Other Ambulatory Visit: Payer: Self-pay

## 2014-08-19 MED ORDER — AZITHROMYCIN 250 MG PO TABS: ORAL_TABLET | ORAL | Status: AC

## 2014-08-19 NOTE — Progress Notes (Signed)
Patient called c/o cough, sore throat, chest congestion and sinus pressure. Per Dr. Melford Aase, patient needs to take Zpak AD. Patient aware and is advised to sched OV if no relief after taking ABX. Zpak was sent to [facility]

## 2015-01-26 ENCOUNTER — Encounter: Payer: Self-pay | Admitting: Internal Medicine

## 2015-01-26 ENCOUNTER — Ambulatory Visit (INDEPENDENT_AMBULATORY_CARE_PROVIDER_SITE_OTHER): Payer: BC Managed Care – PPO | Admitting: Internal Medicine

## 2015-01-26 VITALS — BP 126/82 | HR 68 | Temp 97.5°F | Resp 16 | Ht 68.0 in | Wt 157.6 lb

## 2015-01-26 DIAGNOSIS — E782 Mixed hyperlipidemia: Secondary | ICD-10-CM

## 2015-01-26 DIAGNOSIS — IMO0001 Reserved for inherently not codable concepts without codable children: Secondary | ICD-10-CM

## 2015-01-26 DIAGNOSIS — Z6825 Body mass index (BMI) 25.0-25.9, adult: Secondary | ICD-10-CM

## 2015-01-26 DIAGNOSIS — Z23 Encounter for immunization: Secondary | ICD-10-CM | POA: Diagnosis not present

## 2015-01-26 DIAGNOSIS — R7309 Other abnormal glucose: Secondary | ICD-10-CM

## 2015-01-26 DIAGNOSIS — Z0001 Encounter for general adult medical examination with abnormal findings: Secondary | ICD-10-CM

## 2015-01-26 DIAGNOSIS — Z79899 Other long term (current) drug therapy: Secondary | ICD-10-CM

## 2015-01-26 DIAGNOSIS — Z0000 Encounter for general adult medical examination without abnormal findings: Secondary | ICD-10-CM

## 2015-01-26 DIAGNOSIS — Z111 Encounter for screening for respiratory tuberculosis: Secondary | ICD-10-CM | POA: Diagnosis not present

## 2015-01-26 DIAGNOSIS — R5383 Other fatigue: Secondary | ICD-10-CM

## 2015-01-26 DIAGNOSIS — E559 Vitamin D deficiency, unspecified: Secondary | ICD-10-CM | POA: Diagnosis not present

## 2015-01-26 DIAGNOSIS — Z1212 Encounter for screening for malignant neoplasm of rectum: Secondary | ICD-10-CM

## 2015-01-26 DIAGNOSIS — R030 Elevated blood-pressure reading, without diagnosis of hypertension: Secondary | ICD-10-CM

## 2015-01-26 HISTORY — DX: Mixed hyperlipidemia: E78.2

## 2015-01-26 LAB — CBC WITH DIFFERENTIAL/PLATELET
Basophils Absolute: 0 10*3/uL (ref 0.0–0.1)
Basophils Relative: 0 % (ref 0–1)
EOS ABS: 0.1 10*3/uL (ref 0.0–0.7)
Eosinophils Relative: 1 % (ref 0–5)
HEMATOCRIT: 39.1 % (ref 36.0–46.0)
HEMOGLOBIN: 13.1 g/dL (ref 12.0–15.0)
LYMPHS ABS: 2.3 10*3/uL (ref 0.7–4.0)
LYMPHS PCT: 34 % (ref 12–46)
MCH: 29.5 pg (ref 26.0–34.0)
MCHC: 33.5 g/dL (ref 30.0–36.0)
MCV: 88.1 fL (ref 78.0–100.0)
MONOS PCT: 8 % (ref 3–12)
MPV: 10 fL (ref 8.6–12.4)
Monocytes Absolute: 0.5 10*3/uL (ref 0.1–1.0)
NEUTROS PCT: 57 % (ref 43–77)
Neutro Abs: 3.9 10*3/uL (ref 1.7–7.7)
PLATELETS: 321 10*3/uL (ref 150–400)
RBC: 4.44 MIL/uL (ref 3.87–5.11)
RDW: 12.9 % (ref 11.5–15.5)
WBC: 6.8 10*3/uL (ref 4.0–10.5)

## 2015-01-26 LAB — LIPID PANEL
Cholesterol: 142 mg/dL (ref 125–200)
HDL: 55 mg/dL (ref >=46)
LDL CALC: 68 mg/dL (ref <130)
Total CHOL/HDL Ratio: 2.6 Ratio (ref <=5.0)
Triglycerides: 96 mg/dL (ref <150)
VLDL: 19 mg/dL (ref <30)

## 2015-01-26 LAB — BASIC METABOLIC PANEL WITH GFR
BUN: 12 mg/dL (ref 7–25)
CHLORIDE: 105 mmol/L (ref 98–110)
CO2: 26 mmol/L (ref 20–31)
CREATININE: 0.83 mg/dL (ref 0.50–1.10)
Calcium: 8.9 mg/dL (ref 8.6–10.2)
GFR, Est African American: >89 mL/min (ref >=60)
GFR, Est Non African American: 89 mL/min (ref >=60)
GLUCOSE: 77 mg/dL (ref 65–99)
POTASSIUM: 4 mmol/L (ref 3.5–5.3)
Sodium: 136 mmol/L (ref 135–146)

## 2015-01-26 LAB — HEPATIC FUNCTION PANEL
ALBUMIN: 4.4 g/dL (ref 3.6–5.1)
ALK PHOS: 26 U/L — AB (ref 33–115)
ALT: 9 U/L (ref 6–29)
AST: 14 U/L (ref 10–30)
Bilirubin, Direct: 0.1 mg/dL (ref <=0.2)
Indirect Bilirubin: 0.4 mg/dL (ref 0.2–1.2)
TOTAL PROTEIN: 6.7 g/dL (ref 6.1–8.1)
Total Bilirubin: 0.5 mg/dL (ref 0.2–1.2)

## 2015-01-26 LAB — IRON AND TIBC
%SAT: 29 % (ref 11–50)
Iron: 84 ug/dL (ref 40–190)
TIBC: 292 ug/dL (ref 250–450)
UIBC: 208 ug/dL (ref 125–400)

## 2015-01-26 LAB — MAGNESIUM: MAGNESIUM: 2.3 mg/dL (ref 1.5–2.5)

## 2015-01-26 LAB — TSH: TSH: 1.077 u[IU]/mL (ref 0.350–4.500)

## 2015-01-26 LAB — VITAMIN B12: Vitamin B-12: 611 pg/mL (ref 211–911)

## 2015-01-26 NOTE — Patient Instructions (Addendum)
Recommend Adult Low Dose Aspirin or   coated  Aspirin 81 mg daily   To reduce risk of Colon Cancer 20 %,   Skin Cancer 26 % ,   Melanoma 46%   and   Pancreatic cancer 60%   ++++++++++++++++++++++++++++++++++++++++++++++++++++++  Vitamin D goal   is between 70-100.   Please make sure that you are taking your Vitamin D as directed.   It is very important as a natural anti-inflammatory   helping hair, skin, and nails, as well as reducing stroke and heart attack risk.   It helps your bones and helps with mood.  It also decreases numerous cancer risks so please take it as directed.   Low Vit D is associated with a 200-300% higher risk for CANCER   and 200-300% higher risk for HEART   ATTACK  &  STROKE.   ......................................  It is also associated with higher death rate at younger ages,   autoimmune diseases like Rheumatoid arthritis, Lupus, Multiple Sclerosis.     Also many other serious conditions, like depression, Alzheimer's  Dementia, infertility, muscle aches, fatigue, fibromyalgia - just to name a few.  ++++++++++++++++++++++++++++++++++++++++++++++++  Recommend the book "The END of DIETING" by Dr Joel Fuhrman   & the book "The END of DIABETES " by Dr Joel Fuhrman  At [employer] - get book & Audio CD's     Being diabetic has a  300% increased risk for heart attack, stroke, cancer, and alzheimer- type vascular dementia. It is very important that you work harder with diet by avoiding all foods that are white. Avoid white rice (brown & wild rice is OK), white potatoes (sweetpotatoes in moderation is OK), White bread or wheat bread or anything made out of white flour like bagels, donuts, rolls, buns, biscuits, cakes, pastries, cookies, pizza crust, and pasta (made from white flour & egg whites) - vegetarian pasta or spinach or wheat pasta is OK. Multigrain breads like Arnold's or [employer], or multigrain sandwich thins or flatbreads.  Diet,  exercise and weight loss can reverse and cure diabetes in the early stages.  Diet, exercise and weight loss is very important in the control and prevention of complications of diabetes which affects every system in your body, ie. Brain - dementia/stroke, eyes - glaucoma/blindness, heart - heart attack/heart failure, kidneys - dialysis, stomach - gastric paralysis, intestines - malabsorption, nerves - severe painful neuritis, circulation - gangrene & loss of a leg(s), and finally cancer and Alzheimers.    I recommend avoid fried & greasy foods,  sweets/candy, white rice (brown or wild rice or Quinoa is OK), white potatoes (sweet potatoes are OK) - anything made from white flour - bagels, doughnuts, rolls, buns, biscuits,white and wheat breads, pizza crust and traditional pasta made of white flour & egg white(vegetarian pasta or spinach or wheat pasta is OK).  Multi-grain bread is OK - like multi-grain flat bread or sandwich thins. Avoid alcohol in excess. Exercise is also important.    Eat all the vegetables you want - avoid meat, especially red meat and dairy - especially cheese.  Cheese is the most concentrated form of trans-fats which is the worst thing to clog up our arteries. Veggie cheese is OK which can be found in the fresh produce section at [employer] or [employer] or [employer]  ++++++++++++++++++++++++++++++++++++++++++++++++++ DASH Eating Plan  DASH stands for "Dietary Approaches to Stop Hypertension."   The DASH eating plan is a healthy eating plan that has been shown to reduce high   blood pressure (hypertension). Additional health benefits may include reducing the risk of type 2 diabetes mellitus, heart disease, and stroke. The DASH eating plan may also help with weight loss.  WHAT DO I NEED TO KNOW ABOUT THE DASH EATING PLAN? For the DASH eating plan, you will follow these general guidelines:  Choose foods with a percent daily value for sodium of less than 5% (as listed on the food  label).  Use salt-free seasonings or herbs instead of table salt or sea salt.  Check with your health care provider or pharmacist before using salt substitutes.  Eat lower-sodium products, often labeled as "lower sodium" or "no salt added."  Eat fresh foods.  Eat more vegetables, fruits, and low-fat dairy products.    Choose whole grains. Look for the word "whole" as the first word in the ingredient list.  Choose fish   Limit sweets, desserts, sugars, and sugary drinks.  Choose heart-healthy fats.  Eat veggie cheese   Eat more home-cooked food and less restaurant, buffet, and fast food.  Limit fried foods.  Cook foods using methods other than frying.  Limit canned vegetables. If you do use them, rinse them well to decrease the sodium.  When eating at a restaurant, ask that your food be prepared with less salt, or no salt if possible.                      WHAT FOODS CAN I EAT?  Seek help from a dietitian for individual calorie needs. Grains Whole grain or whole wheat bread. Brown rice. Whole grain or whole wheat pasta. Quinoa, bulgur, and whole grain cereals. Low-sodium cereals. Corn or whole wheat flour tortillas. Whole grain cornbread. Whole grain crackers. Low-sodium crackers.  Vegetables Fresh or frozen vegetables (raw, steamed, roasted, or grilled). Low-sodium or reduced-sodium tomato and vegetable juices. Low-sodium or reduced-sodium tomato sauce and paste. Low-sodium or reduced-sodium canned vegetables.   Fruits All fresh, canned (in natural juice), or frozen fruits.  Meat and Other Protein Products  All fish and seafood.  Dried beans, peas, or lentils. Unsalted nuts and seeds. Unsalted canned beans. Dairy Low-fat dairy products, such as skim or 1% milk, 2% or reduced-fat cheeses, low-fat ricotta or cottage cheese, or plain low-fat yogurt. Low-sodium or reduced-sodium cheeses.  Fats and Oils Tub margarines without trans fats. Light or reduced-fat mayonnaise  and salad dressings (reduced sodium). Avocado. Safflower, olive, or canola oils. Natural peanut or almond butter.  Other Unsalted popcorn and pretzels. The items listed above may not be a complete list of recommended foods or beverages. Contact your dietitian for more options.  +++++++++++++++++++++++++++++++++++++++++++  WHAT FOODS ARE NOT RECOMMENDED?  Grains/ White flour or wheat flour  White bread. White pasta. White rice. Refined cornbread. Bagels and croissants. Crackers that contain trans fat.  Vegetables  Creamed or fried vegetables. Vegetables in a . Regular canned vegetables. Regular canned tomato sauce and paste. Regular tomato and vegetable juices.  Fruits Dried fruits. Canned fruit in light or heavy syrup. Fruit juice.  Meat and Other Protein Products Meat in general. Fatty cuts of meat. Ribs, chicken wings, bacon, sausage, bologna, salami, chitterlings, fatback, hot dogs, bratwurst, and packaged luncheon meats. Salted nuts and seeds. Canned beans with salt.  Dairy Whole or 2% milk, cream, half-and-half, and cream cheese. Whole-fat or sweetened yogurt. Full-fat cheeses or blue cheese. Nondairy creamers and whipped toppings. Processed cheese, cheese spreads, or cheese curds.  Condiments Onion and garlic salt, seasoned salt, table salt, and sea  salt. Canned and packaged gravies. Worcestershire sauce. Tartar sauce. Barbecue sauce. Teriyaki sauce. Soy sauce, including reduced sodium. Steak sauce. Fish sauce. Oyster sauce. Cocktail sauce. Horseradish. Ketchup and mustard. Meat flavorings and tenderizers. Bouillon cubes. Hot sauce. Tabasco sauce. Marinades. Taco seasonings. Relishes.  Fats and Oils Butter, stick margarine, lard, shortening, ghee, and bacon fat. Coconut, palm kernel, or palm oils. Regular salad dressings.  Pickles and olives. Salted popcorn and pretzels. The items listed above may not be a complete list of foods and beverages to avoid.   Preventive Care for  Adults  A healthy lifestyle and preventive care can promote health and wellness. Preventive health guidelines for women include the following key practices.  A routine yearly physical is a good way to check with your health care provider about your health and preventive screening. It is a chance to share any concerns and updates on your health and to receive a thorough exam.  Visit your dentist for a routine exam and preventive care every 6 months. Brush your teeth twice a day and floss once a day. Good oral hygiene prevents tooth decay and gum disease.  The frequency of eye exams is based on your age, health, family medical history, use of contact lenses, and other factors. Follow your health care provider's recommendations for frequency of eye exams.  Eat a healthy diet. Foods like vegetables, fruits, whole grains, low-fat dairy products, and lean protein foods contain the nutrients you need without too many calories. Decrease your intake of foods high in solid fats, added sugars, and salt. Eat the right amount of calories for you.Get information about a proper diet from your health care provider, if necessary.  Regular physical exercise is one of the most important things you can do for your health. Most adults should get at least 150 minutes of moderate-intensity exercise (any activity that increases your heart rate and causes you to sweat) each week. In addition, most adults need muscle-strengthening exercises on 2 or more days a week.  Maintain a healthy weight. The body mass index (BMI) is a screening tool to identify possible weight problems. It provides an estimate of body fat based on height and weight. Your health care provider can find your BMI and can help you achieve or maintain a healthy weight.For adults 20 years and older:  A BMI below 18.5 is considered underweight.  A BMI of 18.5 to 24.9 is normal.  A BMI of 25 to 29.9 is considered overweight.  A BMI of 30 and above is  considered obese.  Maintain normal blood lipids and cholesterol levels by exercising and minimizing your intake of saturated fat. Eat a balanced diet with plenty of fruit and vegetables. Blood tests for lipids and cholesterol should begin at age 64 and be repeated every 5 years. If your lipid or cholesterol levels are high, you are over 50, or you are at high risk for heart disease, you may need your cholesterol levels checked more frequently.Ongoing high lipid and cholesterol levels should be treated with medicines if diet and exercise are not working.  If you smoke, find out from your health care provider how to quit. If you do not use tobacco, do not start.  Lung cancer screening is recommended for adults aged 72-80 years who are at high risk for developing lung cancer because of a history of smoking. A yearly low-dose CT scan of the lungs is recommended for people who have at least a 30-pack-year history of smoking and are a current  smoker or have quit within the past 15 years. A pack year of smoking is smoking an average of 1 pack of cigarettes a day for 1 year (for example: 1 pack a day for 30 years or 2 packs a day for 15 years). Yearly screening should continue until the smoker has stopped smoking for at least 15 years. Yearly screening should be stopped for people who develop a health problem that would prevent them from having lung cancer treatment.  If you are pregnant, do not drink alcohol. If you are breastfeeding, be very cautious about drinking alcohol. If you are not pregnant and choose to drink alcohol, do not have more than 1 drink per day. One drink is considered to be 12 ounces (355 mL) of beer, 5 ounces (148 mL) of wine, or 1.5 ounces (44 mL) of liquor.  Avoid use of street drugs. Do not share needles with anyone. Ask for help if you need support or instructions about stopping the use of drugs.  High blood pressure causes heart disease and increases the risk of stroke. Your blood  pressure should be checked at least every 1 to 2 years. Ongoing high blood pressure should be treated with medicines if weight loss and exercise do not work.  If you are 9-65 years old, ask your health care provider if you should take aspirin to prevent strokes.  Diabetes screening involves taking a blood sample to check your fasting blood sugar level. This should be done once every 3 years, after age 57, if you are within normal weight and without risk factors for diabetes. Testing should be considered at a younger age or be carried out more frequently if you are overweight and have at least 1 risk factor for diabetes.  Breast cancer screening is essential preventive care for women. You should practice "breast self-awareness." This means understanding the normal appearance and feel of your breasts and may include breast self-examination. Any changes detected, no matter how small, should be reported to a health care provider. Women in their 55s and 30s should have a clinical breast exam (CBE) by a health care provider as part of a regular health exam every 1 to 3 years. After age 61, women should have a CBE every year. Starting at age 26, women should consider having a mammogram (breast X-ray test) every year. Women who have a family history of breast cancer should talk to their health care provider about genetic screening. Women at a high risk of breast cancer should talk to their health care providers about having an MRI and a mammogram every year.  Breast cancer gene (BRCA)-related cancer risk assessment is recommended for women who have family members with BRCA-related cancers. BRCA-related cancers include breast, ovarian, tubal, and peritoneal cancers. Having family members with these cancers may be associated with an increased risk for harmful changes (mutations) in the breast cancer genes BRCA1 and BRCA2. Results of the assessment will determine the need for genetic counseling and BRCA1 and BRCA2  testing.  Routine pelvic exams to screen for cancer are no longer recommended for nonpregnant women who are considered low risk for cancer of the pelvic organs (ovaries, uterus, and vagina) and who do not have symptoms. Ask your health care provider if a screening pelvic exam is right for you.  If you have had past treatment for cervical cancer or a condition that could lead to cancer, you need Pap tests and screening for cancer for at least 20 years after your treatment. If Pap  tests have been discontinued, your risk factors (such as having a new sexual partner) need to be reassessed to determine if screening should be resumed. Some women have medical problems that increase the chance of getting cervical cancer. In these cases, your health care provider may recommend more frequent screening and Pap tests.  The HPV test is an additional test that may be used for cervical cancer screening. The HPV test looks for the virus that can cause the cell changes on the cervix. The cells collected during the Pap test can be tested for HPV. The HPV test could be used to screen women aged 16 years and older, and should be used in women of any age who have unclear Pap test results. After the age of 52, women should have HPV testing at the same frequency as a Pap test.  Colorectal cancer can be detected and often prevented. Most routine colorectal cancer screening begins at the age of 59 years and continues through age 4 years. However, your health care provider may recommend screening at an earlier age if you have risk factors for colon cancer. On a yearly basis, your health care provider may provide home test kits to check for hidden blood in the stool. Use of a small camera at the end of a tube, to directly examine the colon (sigmoidoscopy or colonoscopy), can detect the earliest forms of colorectal cancer. Talk to your health care provider about this at age 53, when routine screening begins. Direct exam of the colon  should be repeated every 5-10 years through age 11 years, unless early forms of pre-cancerous polyps or small growths are found.  People who are at an increased risk for hepatitis B should be screened for this virus. You are considered at high risk for hepatitis B if:  You were born in a country where hepatitis B occurs often. Talk with your health care provider about which countries are considered high risk.  Your parents were born in a high-risk country and you have not received a shot to protect against hepatitis B (hepatitis B vaccine).  You have HIV or AIDS.  You use needles to inject street drugs.  You live with, or have sex with, someone who has hepatitis B.  You get hemodialysis treatment.  You take certain medicines for conditions like cancer, organ transplantation, and autoimmune conditions.  Hepatitis C blood testing is recommended for all people born from [birth year] through 1966 and any individual with known risks for hepatitis C.  Practice safe sex. Use condoms and avoid high-risk sexual practices to reduce the spread of sexually transmitted infections (STIs). STIs include gonorrhea, chlamydia, syphilis, trichomonas, herpes, HPV, and human immunodeficiency virus (HIV). Herpes, HIV, and HPV are viral illnesses that have no cure. They can result in disability, cancer, and death.  You should be screened for sexually transmitted illnesses (STIs) including gonorrhea and chlamydia if:  You are sexually active and are younger than 24 years.  You are older than 24 years and your health care provider tells you that you are at risk for this type of infection.  Your sexual activity has changed since you were last screened and you are at an increased risk for chlamydia or gonorrhea. Ask your health care provider if you are at risk.  If you are at risk of being infected with HIV, it is recommended that you take a prescription medicine daily to prevent HIV infection. This is called preexposure  prophylaxis (PrEP). You are considered at risk if:  You are a heterosexual woman, are sexually active, and are at increased risk for HIV infection.  You take drugs by injection.  You are sexually active with a partner who has HIV.  Talk with your health care provider about whether you are at high risk of being infected with HIV. If you choose to begin PrEP, you should first be tested for HIV. You should then be tested every 3 months for as long as you are taking PrEP.  Osteoporosis is a disease in which the bones lose minerals and strength with aging. This can result in serious bone fractures or breaks. The risk of osteoporosis can be identified using a bone density scan. Women ages 47 years and over and women at risk for fractures or osteoporosis should discuss screening with their health care providers. Ask your health care provider whether you should take a calcium supplement or vitamin D to reduce the rate of osteoporosis.  Menopause can be associated with physical symptoms and risks. Hormone replacement therapy is available to decrease symptoms and risks. You should talk to your health care provider about whether hormone replacement therapy is right for you.  Use sunscreen. Apply sunscreen liberally and repeatedly throughout the day. You should seek shade when your shadow is shorter than you. Protect yourself by wearing long sleeves, pants, a wide-brimmed hat, and sunglasses year round, whenever you are outdoors.  Once a month, do a whole body skin exam, using a mirror to look at the skin on your back. Tell your health care provider of new moles, moles that have irregular borders, moles that are larger than a pencil eraser, or moles that have changed in shape or color.  Stay current with required vaccines (immunizations).  Influenza vaccine. All adults should be immunized every year.  Tetanus, diphtheria, and acellular pertussis (Td, Tdap) vaccine. Pregnant women should receive 1 dose of  Tdap vaccine during each pregnancy. The dose should be obtained regardless of the length of time since the last dose. Immunization is preferred during the 27th-36th week of gestation. An adult who has not previously received Tdap or who does not know her vaccine status should receive 1 dose of Tdap. This initial dose should be followed by tetanus and diphtheria toxoids (Td) booster doses every 10 years. Adults with an unknown or incomplete history of completing a 3-dose immunization series with Td-containing vaccines should begin or complete a primary immunization series including a Tdap dose. Adults should receive a Td booster every 10 years.  Varicella vaccine. An adult without evidence of immunity to varicella should receive 2 doses or a second dose if she has previously received 1 dose. Pregnant females who do not have evidence of immunity should receive the first dose after pregnancy. This first dose should be obtained before leaving the health care facility. The second dose should be obtained 4-8 weeks after the first dose.  Human papillomavirus (HPV) vaccine. Females aged 13-26 years who have not received the vaccine previously should obtain the 3-dose series. The vaccine is not recommended for use in pregnant females. However, pregnancy testing is not needed before receiving a dose. If a female is found to be pregnant after receiving a dose, no treatment is needed. In that case, the remaining doses should be delayed until after the pregnancy. Immunization is recommended for any person with an immunocompromised condition through the age of 16 years if she did not get any or all doses earlier. During the 3-dose series, the second dose should be obtained 4-8  weeks after the first dose. The third dose should be obtained 24 weeks after the first dose and 16 weeks after the second dose.  Zoster vaccine. One dose is recommended for adults aged 81 years or older unless certain conditions are  present.  Measles, mumps, and rubella (MMR) vaccine. Adults born before [birth year] generally are considered immune to measles and mumps. Adults born in [birth year] or later should have 1 or more doses of MMR vaccine unless there is a contraindication to the vaccine or there is laboratory evidence of immunity to each of the three diseases. A routine second dose of MMR vaccine should be obtained at least 28 days after the first dose for students attending postsecondary schools, health care workers, or international travelers. People who received inactivated measles vaccine or an unknown type of measles vaccine during 1963-1967 should receive 2 doses of MMR vaccine. People who received inactivated mumps vaccine or an unknown type of mumps vaccine before 1979 and are at high risk for mumps infection should consider immunization with 2 doses of MMR vaccine. For females of childbearing age, rubella immunity should be determined. If there is no evidence of immunity, females who are not pregnant should be vaccinated. If there is no evidence of immunity, females who are pregnant should delay immunization until after pregnancy. Unvaccinated health care workers born before [birth year] who lack laboratory evidence of measles, mumps, or rubella immunity or laboratory confirmation of disease should consider measles and mumps immunization with 2 doses of MMR vaccine or rubella immunization with 1 dose of MMR vaccine.  Pneumococcal 13-valent conjugate (PCV13) vaccine. When indicated, a person who is uncertain of her immunization history and has no record of immunization should receive the PCV13 vaccine. An adult aged 89 years or older who has certain medical conditions and has not been previously immunized should receive 1 dose of PCV13 vaccine. This PCV13 should be followed with a dose of pneumococcal polysaccharide (PPSV23) vaccine. The PPSV23 vaccine dose should be obtained at least 8 weeks after the dose of PCV13 vaccine. An adult aged 43  years or older who has certain medical conditions and previously received 1 or more doses of PPSV23 vaccine should receive 1 dose of PCV13. The PCV13 vaccine dose should be obtained 1 or more years after the last PPSV23 vaccine dose.  Pneumococcal polysaccharide (PPSV23) vaccine. When PCV13 is also indicated, PCV13 should be obtained first. All adults aged 45 years and older should be immunized. An adult younger than age 27 years who has certain medical conditions should be immunized. Any person who resides in a nursing home or long-term care facility should be immunized. An adult smoker should be immunized. People with an immunocompromised condition and certain other conditions should receive both PCV13 and PPSV23 vaccines. People with human immunodeficiency virus (HIV) infection should be immunized as soon as possible after diagnosis. Immunization during chemotherapy or radiation therapy should be avoided. Routine use of PPSV23 vaccine is not recommended for American Indians, [place] Natives, or people younger than 65 years unless there are medical conditions that require PPSV23 vaccine. When indicated, people who have unknown immunization and have no record of immunization should receive PPSV23 vaccine. One-time revaccination 5 years after the first dose of PPSV23 is recommended for people aged 19-64 years who have chronic kidney failure, nephrotic syndrome, asplenia, or immunocompromised conditions. People who received 1-2 doses of PPSV23 before age 77 years should receive another dose of PPSV23 vaccine at age 34 years or later if at least 5 years  have passed since the previous dose. Doses of PPSV23 are not needed for people immunized with PPSV23 at or after age 48 years.  Meningococcal vaccine. Adults with asplenia or persistent complement component deficiencies should receive 2 doses of quadrivalent meningococcal conjugate (MenACWY-D) vaccine. The doses should be obtained at least 2 months apart.  Microbiologists working with certain meningococcal bacteria, [employer] recruits, people at risk during an outbreak, and people who travel to or live in countries with a high rate of meningitis should be immunized. A first-year college student up through age 46 years who is living in a residence hall should receive a dose if she did not receive a dose on or after her 16th birthday. Adults who have certain high-risk conditions should receive one or more doses of vaccine.  Hepatitis A vaccine. Adults who wish to be protected from this disease, have certain high-risk conditions, work with hepatitis A-infected animals, work in hepatitis A research labs, or travel to or work in countries with a high rate of hepatitis A should be immunized. Adults who were previously unvaccinated and who anticipate close contact with an international adoptee during the first 60 days after arrival in the [place] from a country with a high rate of hepatitis A should be immunized.  Hepatitis B vaccine. Adults who wish to be protected from this disease, have certain high-risk conditions, may be exposed to blood or other infectious body fluids, are household contacts or sex partners of hepatitis B positive people, are clients or workers in certain care facilities, or travel to or work in countries with a high rate of hepatitis B should be immunized.  Haemophilus influenzae type b (Hib) vaccine. A previously unvaccinated person with asplenia or sickle cell disease or having a scheduled splenectomy should receive 1 dose of Hib vaccine. Regardless of previous immunization, a recipient of a hematopoietic stem cell transplant should receive a 3-dose series 6-12 months after her successful transplant. Hib vaccine is not recommended for adults with HIV infection. Preventive Services / Frequency  Ages 70 to 11 years  Blood pressure check.  Lipid and cholesterol check.  Clinical breast exam.** / Every 3 years for women in their 43s  and 67s.  BRCA-related cancer risk assessment.** / For women who have family members with a BRCA-related cancer (breast, ovarian, tubal, or peritoneal cancers).  Pap test.** / Every 2 years from ages 38 through 63. Every 3 years starting at age 17 through age 40 or 48 with a history of 3 consecutive normal Pap tests.  HPV screening.** / Every 3 years from ages 59 through ages 61 to 1 with a history of 3 consecutive normal Pap tests.  Hepatitis C blood test.** / For any individual with known risks for hepatitis C.  Skin self-exam. / Monthly.  Influenza vaccine. / Every year.  Tetanus, diphtheria, and acellular pertussis (Tdap, Td) vaccine.** / Consult your health care provider. Pregnant women should receive 1 dose of Tdap vaccine during each pregnancy. 1 dose of Td every 10 years.  Varicella vaccine.** / Consult your health care provider. Pregnant females who do not have evidence of immunity should receive the first dose after pregnancy.  HPV vaccine. / 3 doses over 6 months, if 14 and younger. The vaccine is not recommended for use in pregnant females. However, pregnancy testing is not needed before receiving a dose.  Measles, mumps, rubella (MMR) vaccine.** / You need at least 1 dose of MMR if you were born in 1957 or later. You may also  need a 2nd dose. For females of childbearing age, rubella immunity should be determined. If there is no evidence of immunity, females who are not pregnant should be vaccinated. If there is no evidence of immunity, females who are pregnant should delay immunization until after pregnancy.  Pneumococcal 13-valent conjugate (PCV13) vaccine.** / Consult your health care provider.  Pneumococcal polysaccharide (PPSV23) vaccine.** / 1 to 2 doses if you smoke cigarettes or if you have certain conditions.  Meningococcal vaccine.** / 1 dose if you are age 28 to 31 years and a Market researcher living in a residence hall, or have one of several medical  conditions, you need to get vaccinated against meningococcal disease. You may also need additional booster doses.  Hepatitis A vaccine.** / Consult your health care provider.  Hepatitis B vaccine.** / Consult your health care provider.  Haemophilus influenzae type b (Hib) vaccine.** / Consult your health care provider.

## 2015-01-26 NOTE — Progress Notes (Signed)
Patient ID: Abigail Hicks, female   DOB: 20-Jan-1976, 39 y.o.   MRN: [id number]   Comprehensive Examination  This very nice 39 y.o. MWF presents for  presents for a Wellness Visit & comprehensive evaluation and management of multiple medical co-morbidities.  Patient has been followed for elevated BP, Elevated Blood sugar, elevated Triglycerides and Vitamin D Deficiency.      Patient is screened expectantly for elevated BP and BP has been controlled and today's BP is 126/82. Patient denies any cardiac symptoms as chest pain, palpitations, shortness of breath, dizziness or ankle swelling.       Patient has hx/o elevated in the past.  Last lipids were normal Chol in 2015 with abnormal elevated Trig of 202 in Oct 2015.      Patient had abnormally elevated glucose in Oct 2015 And is screened proactively for prediabetes and patient denies reactive hypoglycemic symptoms, visual blurring, diabetic polys, or paresthesias. Last A1c was 5.3% in Oct 2015.     Finally, patient has history of Vitamin D Deficiency of 33 in 2011 and last Vitamin D was 111 in Oct 2015.    Medication Sig  . aspirin 81 MG tablet Take 81 mg by mouth daily.  . Multiple Vitamin Take 1 tablet by mouth daily.  Marland Kitchen VITAMIN D 2,000 u x 4 caps Take 8,000 daily.   Allergies  Allergen Reactions  . Pneumovax [Pneumococcal Polysaccharide Vaccine]    Past Medical History  Diagnosis Date  . Vitamin D deficiency    Health Maintenance  Topic Date Due  . HIV Screening  04/03/1990  . PAP SMEAR  04/03/1996  . INFLUENZA VACCINE  11/08/2014  . TETANUS/TDAP  12/29/2019   Immunization History  Administered Date(s) Administered  . Influenza Split 01/18/2014  . Influenza-Unspecified 01/12/2013  . PPD Test 01/18/2014  . Pneumococcal Polysaccharide-23 01/03/2011  . Td 12/28/2009   Family History  Problem Relation Age of Onset  . Arthritis Mother     Rhumatoid  . Multiple myeloma Father    Social History  Substance Use Topics  .  Smoking status: Never Smoker   . Smokeless tobacco: None  . Alcohol Use: 3.5 oz/week    7 drink(s) per week    ROS Constitutional: Denies fever, chills, weight loss/gain, headaches, insomnia,  night sweats, and change in appetite. Does c/o fatigue. Eyes: Denies redness, blurred vision, diplopia, discharge, itchy, watery eyes.  ENT: Denies discharge, congestion, post nasal drip, epistaxis, sore throat, earache, hearing loss, dental pain, Tinnitus, Vertigo, Sinus pain, snoring.  Cardio: Denies chest pain, palpitations, irregular heartbeat, syncope, dyspnea, diaphoresis, orthopnea, PND, claudication, edema Respiratory: denies cough, dyspnea, DOE, pleurisy, hoarseness, laryngitis, wheezing.  Gastrointestinal: Denies dysphagia, heartburn, reflux, water brash, pain, cramps, nausea, vomiting, bloating, diarrhea, constipation, hematemesis, melena, hematochezia, jaundice, hemorrhoids Genitourinary: Denies dysuria, frequency, urgency, nocturia, hesitancy, discharge, hematuria, flank pain Breast: Breast lumps, nipple discharge, bleeding.  Musculoskeletal: Denies arthralgia, myalgia, stiffness, Jt. Swelling, pain, limp, and strain/sprain. Denies falls. Skin: Denies puritis, rash, hives, warts, acne, eczema, changing in skin lesion Neuro: No weakness, tremor, incoordination, spasms, paresthesia, pain Psychiatric: Denies confusion, memory loss, sensory loss. Denies Depression. Endocrine: Denies change in weight, skin, hair change, nocturia, and paresthesia, diabetic polys, visual blurring, hyper / hypo glycemic episodes.  Heme/Lymph: No excessive bleeding, bruising, enlarged lymph nodes.  Physical Exam  BP 126/82 mmHg  Pulse 68  Temp(Src) 97.5 F (36.4 C)  Resp 16  Ht _0  (1.727 m)  Wt 157 lb 9.6 oz (71.487 kg)  BMI  23.97 kg/m2  General Appearance: Well nourished and in no apparent distress. Eyes: PERRLA, EOMs, conjunctiva no swelling or erythema, normal fundi and vessels. Sinuses: No  frontal/maxillary tenderness ENT/Mouth: EACs patent / TMs  nl. Nares clear without erythema, swelling, mucoid exudates. Oral hygiene is good. No erythema, swelling, or exudate. Tongue normal, non-obstructing. Tonsils not swollen or erythematous. Hearing normal.  Neck: Supple, thyroid normal. No bruits, nodes or JVD. Respiratory: Respiratory effort normal.  BS equal and clear bilateral without rales, rhonci, wheezing or stridor. Cardio: Heart sounds are normal with regular rate and rhythm and no murmurs, rubs or gallops. Peripheral pulses are normal and equal bilaterally without edema. No aortic or femoral bruits. Chest: symmetric with normal excursions and percussion. Breasts: Deferred to GYN Abdomen: Flat, soft, with bowel sounds. Nontender, no guarding, rebound, hernias, masses, or organomegaly.  Lymphatics: Non tender without lymphadenopathy.  Genitourinary: Deferred to GYN Musculoskeletal: Full ROM all peripheral extremities, joint stability, 5/5 strength, and normal gait. Skin: Warm and dry without rashes, lesions, cyanosis, clubbing or  ecchymosis.  Neuro: Cranial nerves intact, reflexes equal bilaterally. Normal muscle tone, no cerebellar symptoms. Sensation intact.  Pysch: Alert and oriented X 3, normal affect, Insight and Judgment appropriate.   Assessment and Plan  1. Encounter for general adult medical examination with abnormal findings  - POC Hemoccult Bld/Stl  - Vitamin B12 - Iron and TIBC - Urinalysis, Routine w reflex microscopic  - CBC with Differential/Platelet - BASIC METABOLIC PANEL WITH GFR - Hepatic function panel - Magnesium - Lipid panel - TSH - Hemoglobin A1c - Insulin, random - Vit D  25 hydroxy  - Flu vaccine > 3yo with preservative IM (Fluvirin Influenza Split) - PPD  2. Elevated BP  - TSH  3. Mixed hyperlipidemia  - Lipid panel  4. Other abnormal glucose  - Hemoglobin A1c - Insulin, random  5. Vitamin D deficiency  - Vit D  25 hydroxy    6. Screening for rectal cancer   7. BMI 25.12,  adult  - POC Hemoccult Bld/Stl   8. Other fatigue  - Vitamin B12 - Iron and TIBC - TSH  9. Screening examination for pulmonary tuberculosis  - PPD  10. Need for prophylactic vaccination and inoculation against influenza  - Flu vaccine > 3yo with preservative IM (Fluvirin Influenza Split)  11. Medication management  - Urinalysis, Routine w reflex microscopic  - CBC with Differential/Platelet - BASIC METABOLIC PANEL WITH GFR - Hepatic function panel - Magnesium   Continue prudent diet as discussed, weight control, BP monitoring, regular exercise, and medications. Discussed med's effects and SE's. Screening labs and tests as requested with regular follow-up as recommended.  Over 40 minutes of exam, counseling, chart review was performed.

## 2015-01-27 LAB — URINALYSIS, ROUTINE W REFLEX MICROSCOPIC
Bilirubin Urine: NEGATIVE
GLUCOSE, UA: NEGATIVE
Ketones, ur: NEGATIVE
LEUKOCYTES UA: NEGATIVE
Nitrite: NEGATIVE
PH: 6 (ref 5.0–8.0)
Protein, ur: NEGATIVE
SPECIFIC GRAVITY, URINE: 1.007 (ref 1.001–1.035)

## 2015-01-27 LAB — VITAMIN D 25 HYDROXY (VIT D DEFICIENCY, FRACTURES): VIT D 25 HYDROXY: 85 ng/mL (ref 30–100)

## 2015-01-27 LAB — URINALYSIS, MICROSCOPIC ONLY
Bacteria, UA: NONE SEEN [HPF]
CASTS: NONE SEEN [LPF]
Crystals: NONE SEEN [HPF]
RBC / HPF: NONE SEEN RBC/HPF (ref <=2)
WBC, UA: NONE SEEN WBC/HPF (ref <=5)
Yeast: NONE SEEN [HPF]

## 2015-01-27 LAB — HEMOGLOBIN A1C
HEMOGLOBIN A1C: 5.5 % (ref <5.7)
Mean Plasma Glucose: 111 mg/dL (ref <117)

## 2015-01-27 LAB — INSULIN, RANDOM: INSULIN: 6 u[IU]/mL (ref 2.0–19.6)

## 2015-01-31 LAB — TB SKIN TEST
Induration: 0 mm
TB SKIN TEST: NEGATIVE

## 2015-12-27 ENCOUNTER — Other Ambulatory Visit: Payer: Self-pay | Admitting: Internal Medicine

## 2015-12-27 DIAGNOSIS — N390 Urinary tract infection, site not specified: Secondary | ICD-10-CM

## 2016-01-10 ENCOUNTER — Other Ambulatory Visit: Payer: BC Managed Care – PPO

## 2016-01-10 DIAGNOSIS — N390 Urinary tract infection, site not specified: Secondary | ICD-10-CM | POA: Diagnosis not present

## 2016-01-11 LAB — URINALYSIS, ROUTINE W REFLEX MICROSCOPIC
Bilirubin Urine: NEGATIVE
GLUCOSE, UA: NEGATIVE
HGB URINE DIPSTICK: NEGATIVE
KETONES UR: NEGATIVE
Leukocytes, UA: NEGATIVE
NITRITE: NEGATIVE
PH: 5.5 (ref 5.0–8.0)
Protein, ur: NEGATIVE
SPECIFIC GRAVITY, URINE: 1.014 (ref 1.001–1.035)

## 2016-01-12 LAB — URINE CULTURE: Organism ID, Bacteria: NO GROWTH

## 2016-02-23 ENCOUNTER — Ambulatory Visit (INDEPENDENT_AMBULATORY_CARE_PROVIDER_SITE_OTHER): Payer: BC Managed Care – PPO | Admitting: Internal Medicine

## 2016-02-23 ENCOUNTER — Encounter: Payer: Self-pay | Admitting: Internal Medicine

## 2016-02-23 VITALS — BP 124/62 | HR 70 | Temp 98.2°F | Resp 18 | Ht 67.5 in | Wt 163.0 lb

## 2016-02-23 DIAGNOSIS — Z0000 Encounter for general adult medical examination without abnormal findings: Secondary | ICD-10-CM

## 2016-02-23 DIAGNOSIS — Z1212 Encounter for screening for malignant neoplasm of rectum: Secondary | ICD-10-CM

## 2016-02-23 DIAGNOSIS — Z79899 Other long term (current) drug therapy: Secondary | ICD-10-CM | POA: Diagnosis not present

## 2016-02-23 DIAGNOSIS — Z0001 Encounter for general adult medical examination with abnormal findings: Secondary | ICD-10-CM

## 2016-02-23 DIAGNOSIS — E559 Vitamin D deficiency, unspecified: Secondary | ICD-10-CM | POA: Diagnosis not present

## 2016-02-23 DIAGNOSIS — R5383 Other fatigue: Secondary | ICD-10-CM

## 2016-02-23 DIAGNOSIS — Z23 Encounter for immunization: Secondary | ICD-10-CM | POA: Diagnosis not present

## 2016-02-23 DIAGNOSIS — R030 Elevated blood-pressure reading, without diagnosis of hypertension: Secondary | ICD-10-CM

## 2016-02-23 DIAGNOSIS — Z111 Encounter for screening for respiratory tuberculosis: Secondary | ICD-10-CM

## 2016-02-23 DIAGNOSIS — E782 Mixed hyperlipidemia: Secondary | ICD-10-CM

## 2016-02-23 DIAGNOSIS — R52 Pain, unspecified: Secondary | ICD-10-CM

## 2016-02-23 DIAGNOSIS — R7309 Other abnormal glucose: Secondary | ICD-10-CM

## 2016-02-23 LAB — CBC WITH DIFFERENTIAL/PLATELET
BASOS ABS: 0 {cells}/uL (ref 0–200)
Basophils Relative: 0 %
EOS ABS: 62 {cells}/uL (ref 15–500)
EOS PCT: 1 %
HCT: 38.2 % (ref 35.0–45.0)
Hemoglobin: 12.6 g/dL (ref 11.7–15.5)
LYMPHS PCT: 32 %
Lymphs Abs: 1984 cells/uL (ref 850–3900)
MCH: 29.3 pg (ref 27.0–33.0)
MCHC: 33 g/dL (ref 32.0–36.0)
MCV: 88.8 fL (ref 80.0–100.0)
MONOS PCT: 8 %
MPV: 9.8 fL (ref 7.5–12.5)
Monocytes Absolute: 496 cells/uL (ref 200–950)
NEUTROS ABS: 3658 {cells}/uL (ref 1500–7800)
Neutrophils Relative %: 59 %
PLATELETS: 310 10*3/uL (ref 140–400)
RBC: 4.3 MIL/uL (ref 3.80–5.10)
RDW: 12.7 % (ref 11.0–15.0)
WBC: 6.2 10*3/uL (ref 3.8–10.8)

## 2016-02-23 LAB — TSH: TSH: 0.87 m[IU]/L

## 2016-02-23 NOTE — Patient Instructions (Signed)

## 2016-02-23 NOTE — Progress Notes (Signed)
[facility] Unk Pinto, M.D.    Uvaldo Bristle. Silverio Lay, P.A.-C      Starlyn Skeans, P.A.-C  [facility]                [address] [place] [address] Telephone [phone] Telefax [phone]  Annual Screening/Preventative Visit & Comprehensive Evaluation &  Examination     This very nice 40 y.o. MWF presents for a Screening/Preventative Visit.  Patient is screened for elevated BP, lipids, glucose and Vitamin D Deficiency.       Patient's BP has been controlled at home and patient denies any cardiac symptoms as chest pain, palpitations, shortness of breath, dizziness or ankle swelling. Today's BP: 124/62      Patient has hx/o dyslipidemia with elevated Trig's in the past. Last lipids were at goal: Lab Results  Component Value Date   CHOL 142 01/26/2015   HDL 55 01/26/2015   LDLCALC 68 01/26/2015   TRIG 96 01/26/2015   CHOLHDL 2.6 01/26/2015      Patient has hx/o elevated glucose in the past & is therefore screened expectantly for preDiabetes and patient denies reactive hypoglycemic symptoms, visual blurring, diabetic polys, or paresthesias. Last A1c was at goal:  Lab Results  Component Value Date   HGBA1C 5.5 01/26/2015      Finally, patient has history of Vitamin D Deficiency in 2011 of "33"  and last Vitamin D was at goal: Lab Results  Component Value Date   VD25OH 73 01/26/2015   Current Outpatient Prescriptions on File Prior to Visit  Medication Sig  . aspirin 81 MG tablet Take 81 mg by mouth daily.  . Cholecalciferol (VITAMIN D PO) Take 2,000 Units by mouth. Takes 4 daily to = 8000 units daily  . Multiple Vitamin (MULTIVITAMIN) tablet Take 1 tablet by mouth daily.   No current facility-administered medications on file prior to visit.    Allergies  Allergen Reactions  . Pneumovax [Pneumococcal Polysaccharide Vaccine]    Past Medical History:  Diagnosis Date  . Vitamin  D deficiency    Health Maintenance  Topic Date Due  . HIV Screening  04/03/1990  . PAP SMEAR  04/03/1996  . INFLUENZA VACCINE  11/08/2015  . TETANUS/TDAP  12/29/2019   Immunization History  Administered Date(s) Administered  . Influenza Split 01/18/2014, 01/26/2015  . Influenza,inj,quad, With Preservative 02/23/2016  . Influenza-Unspecified 01/12/2013  . PPD Test 01/18/2014, 01/26/2015  . Pneumococcal Polysaccharide-23 01/03/2011  . Td 12/28/2009   No past surgical history on file.   Family History  Problem Relation Age of Onset  . Arthritis Mother     Rhumatoid  . Multiple myeloma Father    Social History  Substance Use Topics  . Smoking status: Never Smoker  . Smokeless tobacco: Not on file  . Alcohol use 3.5 oz/week    7 drink(s) per week    ROS Constitutional: Denies fever, chills, weight loss/gain, headaches, insomnia,  night sweats, and change in appetite. Does c/o fatigue. Eyes: Denies redness, blurred vision, diplopia, discharge, itchy, watery eyes.  ENT: Denies discharge, congestion, post nasal drip, epistaxis, sore throat, earache, hearing loss, dental pain, Tinnitus, Vertigo, Sinus pain, snoring.  Cardio: Denies chest pain, palpitations, irregular heartbeat, syncope, dyspnea, diaphoresis, orthopnea, PND, claudication, edema Respiratory: denies cough, dyspnea, DOE, pleurisy, hoarseness, laryngitis, wheezing.  Gastrointestinal: Denies dysphagia, heartburn, reflux, water brash, pain, cramps, nausea, vomiting,  bloating, diarrhea, constipation, hematemesis, melena, hematochezia, jaundice, hemorrhoids Genitourinary: Denies dysuria, frequency, urgency, nocturia, hesitancy, discharge, hematuria, flank pain Breast: Breast lumps, nipple discharge, bleeding.  Musculoskeletal: Does endorse arthralgias and  myalgias, occasional stiffness, but denies Jt. Swelling, pain, limp, and strain/sprain. Denies falls. Skin: Denies puritis, rash, hives, warts, acne, eczema, changing in  skin lesion Neuro: No weakness, tremor, incoordination, spasms, paresthesia, pain Psychiatric: Denies confusion, memory loss, sensory loss. Denies Depression. Endocrine: Denies change in weight, skin, hair change, nocturia, and paresthesia, diabetic polys, visual blurring, hyper / hypo glycemic episodes.  Heme/Lymph: No excessive bleeding, bruising, enlarged lymph nodes.  Physical Exam  BP 124/62   Pulse 70   Temp 98.2 F (36.8 C) (Temporal)   Resp 18   Ht 5' 7.5" (1.715 m)   Wt 163 lb (73.9 kg)   LMP 02/08/2016   BMI 25.15 kg/m   General Appearance: Well nourished and in no apparent distress.  Eyes: PERRLA, EOMs, conjunctiva no swelling or erythema, normal fundi and vessels. Sinuses: No frontal/maxillary tenderness ENT/Mouth: EACs patent / TMs  nl. Nares clear without erythema, swelling, mucoid exudates. Oral hygiene is good. No erythema, swelling, or exudate. Tongue normal, non-obstructing. Tonsils not swollen or erythematous. Hearing normal.  Neck: Supple, thyroid normal. No bruits, nodes or JVD. Respiratory: Respiratory effort normal.  BS equal and clear bilateral without rales, rhonci, wheezing or stridor. Cardio: Heart sounds are normal with regular rate and rhythm and no murmurs, rubs or gallops. Peripheral pulses are normal and equal bilaterally without edema. No aortic or femoral bruits. Chest: symmetric with normal excursions and percussion. Breasts:  Deferred to GYN.  Abdomen: Flat, soft with bowel sounds active. Nontender, no guarding, rebound, hernias, masses, or organomegaly.  Lymphatics: Non tender without lymphadenopathy.  Genitourinary: Deferred to GYN. Musculoskeletal: Full ROM all peripheral extremities, joint stability, 5/5 strength, and normal gait. Skin: Warm and dry without rashes, lesions, cyanosis, clubbing or  ecchymosis.  Neuro: Cranial nerves intact, reflexes equal bilaterally. Normal muscle tone, no cerebellar symptoms. Sensation intact.  Pysch: Alert  and oriented X 3, normal affect, Insight and Judgment appropriate.   Assessment and Plan  1. Annual Preventative Screening Examination  - Microalbumin / creatinine urine ratio - EKG 12-Lead - POC Hemoccult Bld/Stl e - Urinalysis, Routine w reflex microscopic  - Vitamin B12 - Iron and TIBC - CBC with Differential/Platelet - BASIC METABOLIC PANEL WITH GFR - Hepatic function panel - Magnesium - Lipid panel - TSH - Hemoglobin A1c - Insulin, random - VITAMIN D 25 Hydroxy  - Sedimentation rate - Anti-DNA antibody, double-stranded - C3 and C4 - Cyclic citrul peptide antibody, IgG  2. Elevated BP without diagnosis of hypertension  - Microalbumin / creatinine urine ratio - EKG 12-Lead - TSH  3. Mixed hyperlipidemia  - Lipid panel - TSH  4. Other abnormal glucose  - Hemoglobin A1c - Insulin, random  5. Vitamin D deficiency  - VITAMIN D 25 Hydroxy   6. Screening for rectal cancer  - POC Hemoccult Bld/Stl   7. Screening examination for pulmonary tuberculosis   8. Medication management  - CBC with Differential/Platelet - BASIC METABOLIC PANEL WITH GFR - Hepatic function panel - Magnesium  9. Aches  (as mother has Rheumatoid Arthritis and 2 other 1st order female relatives have Lupus, will investigate for Autoimmune CTD) - Sedimentation rate - Anti-DNA antibody, double-stranded - C3 and C4  10. Fatigue, unspecified type  - Vitamin B12 - Iron and TIBC - CBC with Differential/Platelet - TSH  11.  Need for prophylactic vaccination and inoculation against influenza  - Flu Vaccine QUAD with presevative       Continue prudent diet as discussed, weight control, BP monitoring, regular exercise, and medications. Discussed med's effects and SE's. Screening labs and tests as requested with regular follow-up as recommended. Over 40 minutes of exam, counseling, chart review and high complex critical decision making was performed.

## 2016-02-24 LAB — HEPATIC FUNCTION PANEL
ALBUMIN: 4.3 g/dL (ref 3.6–5.1)
ALT: 10 U/L (ref 6–29)
AST: 17 U/L (ref 10–30)
Alkaline Phosphatase: 30 U/L — ABNORMAL LOW (ref 33–115)
BILIRUBIN DIRECT: 0.1 mg/dL (ref <=0.2)
BILIRUBIN TOTAL: 0.5 mg/dL (ref 0.2–1.2)
Indirect Bilirubin: 0.4 mg/dL (ref 0.2–1.2)
Total Protein: 6.7 g/dL (ref 6.1–8.1)

## 2016-02-24 LAB — INSULIN, RANDOM: INSULIN: 20.8 u[IU]/mL — AB (ref 2.0–19.6)

## 2016-02-24 LAB — URINALYSIS, MICROSCOPIC ONLY
BACTERIA UA: NONE SEEN [HPF]
CRYSTALS: NONE SEEN [HPF]
Casts: NONE SEEN [LPF]
YEAST: NONE SEEN [HPF]

## 2016-02-24 LAB — BASIC METABOLIC PANEL WITH GFR
BUN: 14 mg/dL (ref 7–25)
CALCIUM: 9.3 mg/dL (ref 8.6–10.2)
CO2: 22 mmol/L (ref 20–31)
CREATININE: 0.94 mg/dL (ref 0.50–1.10)
Chloride: 105 mmol/L (ref 98–110)
GFR, EST AFRICAN AMERICAN: 88 mL/min (ref >=60)
GFR, Est Non African American: 76 mL/min (ref >=60)
Glucose, Bld: 87 mg/dL (ref 65–99)
Potassium: 3.9 mmol/L (ref 3.5–5.3)
SODIUM: 139 mmol/L (ref 135–146)

## 2016-02-24 LAB — VITAMIN D 25 HYDROXY (VIT D DEFICIENCY, FRACTURES): VIT D 25 HYDROXY: 96 ng/mL (ref 30–100)

## 2016-02-24 LAB — LIPID PANEL
CHOL/HDL RATIO: 2.4 ratio (ref <5.0)
CHOLESTEROL: 149 mg/dL (ref <200)
HDL: 62 mg/dL (ref >50)
LDL Cholesterol: 71 mg/dL (ref <100)
TRIGLYCERIDES: 82 mg/dL (ref <150)
VLDL: 16 mg/dL (ref <30)

## 2016-02-24 LAB — MICROALBUMIN / CREATININE URINE RATIO
Creatinine, Urine: 152 mg/dL (ref 20–320)
MICROALB UR: 0.7 mg/dL
Microalb Creat Ratio: 5 mcg/mg creat (ref <30)

## 2016-02-24 LAB — VITAMIN B12: VITAMIN B 12: 351 pg/mL (ref 200–1100)

## 2016-02-24 LAB — IRON AND TIBC
%SAT: 30 % (ref 11–50)
Iron: 91 ug/dL (ref 40–190)
TIBC: 301 ug/dL (ref 250–450)
UIBC: 210 ug/dL (ref 125–400)

## 2016-02-24 LAB — MAGNESIUM: MAGNESIUM: 2.1 mg/dL (ref 1.5–2.5)

## 2016-02-24 LAB — URINALYSIS, ROUTINE W REFLEX MICROSCOPIC
BILIRUBIN URINE: NEGATIVE
GLUCOSE, UA: NEGATIVE
Hgb urine dipstick: NEGATIVE
Ketones, ur: NEGATIVE
Nitrite: NEGATIVE
PROTEIN: NEGATIVE
SPECIFIC GRAVITY, URINE: 1.02 (ref 1.001–1.035)
pH: 5.5 (ref 5.0–8.0)

## 2016-02-24 LAB — HEMOGLOBIN A1C
Hgb A1c MFr Bld: 4.9 % (ref <5.7)
Mean Plasma Glucose: 94 mg/dL

## 2016-02-24 LAB — C3 AND C4
C3 COMPLEMENT: 91 mg/dL (ref 90–180)
C4 COMPLEMENT: 24 mg/dL (ref 16–47)

## 2016-02-24 LAB — SEDIMENTATION RATE: SED RATE: 1 mm/h (ref 0–20)

## 2016-02-24 LAB — CYCLIC CITRUL PEPTIDE ANTIBODY, IGG: Cyclic Citrullin Peptide Ab: <16 Units

## 2016-02-24 LAB — ANTI-DNA ANTIBODY, DOUBLE-STRANDED: ds DNA Ab: <1 IU/mL

## 2016-03-16 ENCOUNTER — Encounter: Payer: Self-pay | Admitting: Internal Medicine

## 2016-04-12 ENCOUNTER — Ambulatory Visit (INDEPENDENT_AMBULATORY_CARE_PROVIDER_SITE_OTHER): Payer: BC Managed Care – PPO | Admitting: Internal Medicine

## 2016-04-12 VITALS — BP 118/72 | HR 72 | Temp 97.3°F | Resp 16 | Ht 67.5 in | Wt 164.2 lb

## 2016-04-12 DIAGNOSIS — D229 Melanocytic nevi, unspecified: Secondary | ICD-10-CM | POA: Diagnosis not present

## 2016-04-12 NOTE — Progress Notes (Signed)
Chief Complaint: Patient presents for evaluation of a skin lesion.   Exam:    Patient presents for evaluation of a 3 x 3 mm mixed  Medium/dark brown flat lesion with irregular margins on the posterior mid Right calf.   Anesthesia: 0.2 ml Marcaine 0.5% w/ epi   Procedure Details   The risks, benefits, indications, potential complications, and alternatives were explained to the patient and informed consent obtained.  ELECTRO: The lesion and surrounding area was given sterile prep using alcohol  in the usual sterile fashion. A 10 scalpel was used to excise an elliptical area of skin approximately 3  mm by 3  m. The wound was closed with electrocaudry. Antibiotic ointment and a sterile dressing applied. The specimen was sent for pathologic examination. The patient tolerated the procedure well with minimal blood loss.   Condition: Stable  Complications:  None  Diagnosis: Atypical nevus, unspecified D 22.9  Procedure code: (CPT: 11300) excisional shave bx  Plan: 1. Instructed to keep the wound dry and covered for3 - 5 days and clean thereafter. 2. Warning signs of infection were reviewed.    3. Recommended that the patient use OTC acetaminophen as needed for pain.

## 2016-05-21 ENCOUNTER — Ambulatory Visit: Payer: Self-pay | Admitting: Internal Medicine

## 2017-02-15 ENCOUNTER — Ambulatory Visit: Payer: BC Managed Care – PPO | Admitting: Internal Medicine

## 2017-02-15 VITALS — BP 114/76 | HR 64 | Temp 97.7°F | Resp 16 | Ht 67.5 in | Wt 161.8 lb

## 2017-02-15 DIAGNOSIS — S46912A Strain of unspecified muscle, fascia and tendon at shoulder and upper arm level, left arm, initial encounter: Secondary | ICD-10-CM | POA: Diagnosis not present

## 2017-02-15 DIAGNOSIS — G5622 Lesion of ulnar nerve, left upper limb: Secondary | ICD-10-CM | POA: Diagnosis not present

## 2017-02-15 MED ORDER — PREDNISONE 20 MG PO TABS: ORAL_TABLET | ORAL | 0 refills | Status: DC

## 2017-02-16 ENCOUNTER — Encounter: Payer: Self-pay | Admitting: Internal Medicine

## 2017-02-16 NOTE — Patient Instructions (Signed)
Shoulder Impingement Syndrome Shoulder impingement syndrome is a condition that causes pain when connective tissues (tendons) surrounding the shoulder joint become pinched. These tendons are part of the group of muscles and tissues that help to stabilize the shoulder (rotator cuff). Beneath the rotator cuff is a fluid-filled sac (bursa) that allows the muscles and tendons to glide smoothly. The bursa may become swollen or irritated (bursitis). Bursitis, swelling in the rotator cuff tendons, or both conditions can decrease how much space is under a bone in the shoulder joint (acromion), resulting in impingement. What are the causes? Shoulder impingement syndrome can be caused by bursitis or swelling of the rotator cuff tendons, which may result from:  Repetitive overhead arm movements.  Falling onto the shoulder.  Weakness in the shoulder muscles.  What increases the risk? You may be more likely to develop this condition if you are an athlete who participates in:  Sports that involve throwing, such as baseball.  Tennis.  Swimming.  Volleyball.  Some people are also more likely to develop impingement syndrome because of the shape of their acromion bone. What are the signs or symptoms? The main symptom of this condition is pain on the front or side of the shoulder. Pain may:  Get worse when lifting or raising the arm.  Get worse at night.  Wake you up from sleeping.  Feel sharp when the shoulder is moved, and then fade to an ache.  Other signs and symptoms may include:  Tenderness.  Stiffness.  Inability to raise the arm above shoulder level or behind the body.  Weakness.  How is this diagnosed? This condition may be diagnosed based on:  Your symptoms.  Your medical history.  A physical exam.  Imaging tests, such as: ? X-rays. ? MRI. ? Ultrasound.  How is this treated? Treatment for this condition may include:  Resting your shoulder and avoiding all  activities that cause pain or put stress on the shoulder.  Icing your shoulder.  NSAIDs to help reduce pain and swelling.  One or more injections of medicines to numb the area and reduce inflammation.  Physical therapy.  Surgery. This may be needed if nonsurgical treatments have not helped. Surgery may involve repairing the rotator cuff, reshaping the acromion, or removing the bursa.  Follow these instructions at home: Managing pain, stiffness, and swelling  If directed, apply ice to the injured area. ? Put ice in a plastic bag. ? Place a towel between your skin and the bag. ? Leave the ice on for 20 minutes, 2-3 times a day. Activity  Rest and return to your normal activities as told by your health care provider. Ask your health care provider what activities are safe for you.  Do exercises as told by your health care provider. General instructions  Do not use any tobacco products, including cigarettes, chewing tobacco, or e-cigarettes. Tobacco can delay healing. If you need help quitting, ask your health care provider.  Ask your health care provider when it is safe for you to drive.  Take over-the-counter and prescription medicines only as told by your health care provider.  Keep all follow-up visits as told by your health care provider. This is important. How is this prevented?  Give your body time to rest between periods of activity.  Be safe and responsible while being active to avoid falls.  Maintain physical fitness, including strength and flexibility. Contact a health care provider if:  Your symptoms have not improved after 1-2 months of treatment and   rest.  You cannot lift your arm away from your body. This information is not intended to replace advice given to you by your health care provider. Make sure you discuss any questions you have with your health care provider. Document Released: 03/26/2005 Document Revised: 12/01/2015 Document Reviewed:  02/26/2015 Elsevier Interactive Patient Education  2018 [employer]vier Inc[employer]  

## 2017-02-16 NOTE — Progress Notes (Signed)
[facility]   Unk Pinto, M.D.    Uvaldo Bristle. Silverio Lay, P.A.-C      Liane Comber, [place]                [address] [place], [place] [address] Telephone [phone] Telefax [phone] _ Subjective:    Patient ID: MCCARTNEY Abigail Hicks, female    DOB: 06-04-1975, 41 y.o.   MRN: [id number]  HPI  This very nice 41 yo RH  MWF presents with a 2 month prodrome of Lt shoulder discomfort with k/o antecedent injury. She reports limiting discomfort wiht abduction above the horizontal  And with extremes of internal/external shoulder rotation.  She also relates more recent decreased grip of the L hand  & tingling paresthesias of the 4th & 5th fingers.   Medication Sig  . aspirin 81 MG  Takes 1 tablet daily  daily.  Marland Kitchen VITAMIN D 2,000 Units Takes 4 daily  = 8000 units daily  . Multiple Vitamin  Takes 1 tablet  daily.    Allergies  Allergen Reactions  . Pneumovax [Pneumococcal Polysaccharide Vaccine] Other (See Comments)  . Sulfamethoxazole-Trimethoprim Rash    Unsure of reaction, childhood allergy    Past Medical History:  Diagnosis Date  . Vitamin D deficiency    No past surgical history on file.  Review of Systems  10 point systems review negative except as above.    Objective:   Physical Exam  BP 114/76   Pulse 64   Temp 97.7 F (36.5 C)   Resp 16   Ht 5' 7.5" (1.715 m)   Wt 161 lb 12.8 oz (73.4 kg)   BMI 24.97 kg/m   HEENT - WNL. Neck - supple.  Chest - Clear equal BS. Cor - Nl HS. RRR w/o sig m. MS- Decreased  abduction and internal/external rotation of the L shoulder with tenderness of the anterior joint line. Sensory motor testing of the LUE is Nl except sl decreased grip. Gait Nl. Neuro -  Nl w/o focal abnormalities.    Assessment & Plan:   1. Strain of left shoulder, initial encounter   2. Ulnar neuropathy of left upper extremity  - Empiric trial w/ Rx Prednisone 20  mg tabs #20 pulse/taper - discussed meds /SE's.  - advised if not significantly better in 5-7 day to call for Ortho referral.

## 2017-04-03 ENCOUNTER — Ambulatory Visit: Payer: BC Managed Care – PPO | Admitting: Internal Medicine

## 2017-04-03 VITALS — BP 112/64 | HR 72 | Temp 97.2°F | Resp 16 | Ht 68.0 in | Wt 158.6 lb

## 2017-04-03 DIAGNOSIS — E559 Vitamin D deficiency, unspecified: Secondary | ICD-10-CM

## 2017-04-03 DIAGNOSIS — Z136 Encounter for screening for cardiovascular disorders: Secondary | ICD-10-CM | POA: Diagnosis not present

## 2017-04-03 DIAGNOSIS — Z111 Encounter for screening for respiratory tuberculosis: Secondary | ICD-10-CM | POA: Diagnosis not present

## 2017-04-03 DIAGNOSIS — R030 Elevated blood-pressure reading, without diagnosis of hypertension: Secondary | ICD-10-CM | POA: Diagnosis not present

## 2017-04-03 DIAGNOSIS — Z1211 Encounter for screening for malignant neoplasm of colon: Secondary | ICD-10-CM

## 2017-04-03 DIAGNOSIS — Z1212 Encounter for screening for malignant neoplasm of rectum: Secondary | ICD-10-CM

## 2017-04-03 DIAGNOSIS — I10 Essential (primary) hypertension: Secondary | ICD-10-CM

## 2017-04-03 DIAGNOSIS — R5383 Other fatigue: Secondary | ICD-10-CM

## 2017-04-03 DIAGNOSIS — Z0001 Encounter for general adult medical examination with abnormal findings: Secondary | ICD-10-CM

## 2017-04-03 DIAGNOSIS — Z79899 Other long term (current) drug therapy: Secondary | ICD-10-CM | POA: Diagnosis not present

## 2017-04-03 DIAGNOSIS — Z0000 Encounter for general adult medical examination without abnormal findings: Secondary | ICD-10-CM

## 2017-04-03 DIAGNOSIS — E782 Mixed hyperlipidemia: Secondary | ICD-10-CM

## 2017-04-03 DIAGNOSIS — R7309 Other abnormal glucose: Secondary | ICD-10-CM

## 2017-04-03 NOTE — Patient Instructions (Signed)

## 2017-04-03 NOTE — Progress Notes (Signed)
[facility] Unk Pinto, M.D.     Uvaldo Bristle. Silverio Lay, P.A.-C Liane Comber, [place] [address] [place], [place] [address] Telephone [phone] Telefax [phone] Annual Screening/Preventative Visit & Comprehensive Evaluation &  Examination     This very nice 41 y.o. MWF presents for a Screening/Preventative Visit & comprehensive evaluation and management of multiple medical co-morbidities.  Patient has been followed expectantly for elevated BP, abn glucose, cholesterol and Vitamin D Deficiency.      Patient's BP has been controlled at home and patient denies any cardiac symptoms as chest pain, palpitations, shortness of breath, dizziness or ankle swelling. Today's BP s at goal - 112/64      Patient's cholesterol has been controlled albeit hx/o elevated Trig's in the past with diet.  Last lipids were at goal: Lab Results  Component Value Date   CHOL 149 02/23/2016   HDL 62 02/23/2016   LDLCALC 71 02/23/2016   TRIG 82 02/23/2016   CHOLHDL 2.4 02/23/2016      Patient has hx/o abnormal glucoses in the past and is screened expectantly for  prediabetes and patient denies reactive hypoglycemic symptoms, visual blurring, diabetic polys, or paresthesias. Last A1c was  Lab Results  Component Value Date   HGBA1C 4.9 02/23/2016      Finally, patient has history of Vitamin D Deficiency ("33" / 2011) and last Vitamin D was at goal: Lab Results  Component Value Date   VD25OH 96 02/23/2016   Current Outpatient Medications on File Prior to Visit  Medication Sig  . aspirin 81 MG tablet Take 81 mg by mouth daily.  . Cholecalciferol (VITAMIN D PO) Take 2,000 Units by mouth. Takes 4 daily to = 8000 units daily  . Multiple Vitamin (MULTIVITAMIN) tablet Take 1 tablet by mouth daily.   No current facility-administered medications on file prior to visit.    Allergies  Allergen Reactions  . Pneumovax  [Pneumococcal Polysaccharide Vaccine] Other (See Comments)  . Sulfamethoxazole-Trimethoprim Rash    Unsure of reaction, childhood allergy    Past Medical History:  Diagnosis Date  . Vitamin D deficiency    Health Maintenance  Topic Date Due  . HIV Screening  04/03/1990  . PAP SMEAR  04/03/1996  . INFLUENZA VACCINE  11/07/2016  . TETANUS/TDAP  12/29/2019   Immunization History  Administered Date(s) Administered  . Influenza Split 01/18/2014, 01/26/2015  . Influenza,inj,quad, With Preservative 02/23/2016  . Influenza-Unspecified 01/12/2013, 02/07/2017  . PPD Test 01/18/2014, 01/26/2015  . Pneumococcal Polysaccharide-23 01/03/2011  . Td 12/28/2009   Last Colon -  Last Pap -  No past surgical history on file. Family History  Problem Relation Age of Onset  . Arthritis Mother        Rhumatoid  . Multiple myeloma Father    Social History   Tobacco Use  . Smoking status: Never Smoker  . Smokeless tobacco: Never Used  Substance Use Topics  . Alcohol use: Yes    Alcohol/week: 3.5 oz    Types: 7 Standard drinks or equivalent per week  . Drug use: No    ROS Constitutional: Denies fever, chills, weight loss/gain, headaches, insomnia,  night sweats, and change in appetite. Does c/o fatigue. Eyes: Denies redness, blurred vision, diplopia, discharge, itchy, watery eyes.  ENT: Denies discharge, congestion, post nasal drip, epistaxis, sore throat, earache, hearing loss, dental pain, Tinnitus, Vertigo, Sinus pain, snoring.  Cardio: Denies chest pain, palpitations, irregular heartbeat, syncope, dyspnea, diaphoresis, orthopnea, PND, claudication,  edema Respiratory: denies cough, dyspnea, DOE, pleurisy, hoarseness, laryngitis, wheezing.  Gastrointestinal: Denies dysphagia, heartburn, reflux, water brash, pain, cramps, nausea, vomiting, bloating, diarrhea, constipation, hematemesis, melena, hematochezia, jaundice, hemorrhoids Genitourinary: Denies dysuria, frequency, urgency, nocturia,  hesitancy, discharge, hematuria, flank pain Breast: Breast lumps, nipple discharge, bleeding.  Musculoskeletal: Denies arthralgia, myalgia, stiffness, Jt. Swelling, pain, limp, and strain/sprain. Denies falls. Skin: Denies puritis, rash, hives, warts, acne, eczema, changing in skin lesion Neuro: No weakness, tremor, incoordination, spasms, paresthesia, pain Psychiatric: Denies confusion, memory loss, sensory loss. Denies Depression. Endocrine: Denies change in weight, skin, hair change, nocturia, and paresthesia, diabetic polys, visual blurring, hyper / hypo glycemic episodes.  Heme/Lymph: No excessive bleeding, bruising, enlarged lymph nodes.  Physical Exam  BP 112/64   Pulse 72   Temp (!) 97.2 F (36.2 C)   Resp 16   Ht _0  (1.727 m)   Wt 158 lb 9.6 oz (71.9 kg)   BMI 24.12 kg/m   General Appearance: Well nourished, well groomed and in no apparent distress.  Eyes: PERRLA, EOMs, conjunctiva no swelling or erythema, normal fundi and vessels. Sinuses: No frontal/maxillary tenderness ENT/Mouth: EACs patent / TMs  nl. Nares clear without erythema, swelling, mucoid exudates. Oral hygiene is good. No erythema, swelling, or exudate. Tongue normal, non-obstructing. Tonsils not swollen or erythematous. Hearing normal.  Neck: Supple, thyroid normal. No bruits, nodes or JVD. Respiratory: Respiratory effort normal.  BS equal and clear bilateral without rales, rhonci, wheezing or stridor. Cardio: Heart sounds are normal with regular rate and rhythm and no murmurs, rubs or gallops. Peripheral pulses are normal and equal bilaterally without edema. No aortic or femoral bruits. Chest: symmetric with normal excursions and percussion. Breasts: Symmetric, without lumps, nipple discharge, retractions, or fibrocystic changes.  Abdomen: Flat, soft with bowel sounds active. Nontender, no guarding, rebound, hernias, masses, or organomegaly.  Lymphatics: Non tender without lymphadenopathy.  Genitourinary:   Musculoskeletal: Full ROM all peripheral extremities, joint stability, 5/5 strength, and normal gait. Skin: Warm and dry without rashes, lesions, cyanosis, clubbing or  ecchymosis.  Neuro: Cranial nerves intact, reflexes equal bilaterally. Normal muscle tone, no cerebellar symptoms. Sensation intact.  Pysch: Alert and oriented X 3, normal affect, Insight and Judgment appropriate.   Assessment and Plan  1. Annual Preventative Screening Examination  2. Elevated BP without diagnosis of hypertension  - Urinalysis, Routine w reflex microscopic - Microalbumin / creatinine urine ratio - CBC with Differential/Platelet - BASIC METABOLIC PANEL WITH GFR - Magnesium - TSH  3. Hyperlipidemia, mixed  - Hepatic function panel - Lipid panel - TSH  4. Other abnormal glucose  - Hemoglobin A1c - Insulin, random  5. Vitamin D deficiency  - VITAMIN D 25 Hydroxy  6. Screening for colorectal cancer  - POC Hemoccult Bld/Stl (3-Cd Home Screen  7. Screening examination for pulmonary tuberculosis  - PPD  8. Fatigue  - Iron,Total/Total Iron Binding Cap - Vitamin B12 - CBC with Differential/Platelet - TSH  9. Medication management  - Urinalysis, Routine w reflex microscopic - Microalbumin / creatinine urine ratio - CBC with Differential/Platelet - BASIC METABOLIC PANEL WITH GFR - Hepatic function panel - Magnesium - Lipid panel - TSH - Hemoglobin A1c - Insulin, random - VITAMIN D 25 Hydroxy        Patient was counseled in prudent diet to achieve/maintain BMI less than 25 for weight control, BP monitoring, regular exercise and medications. Discussed med's effects and SE's. Screening labs and tests as requested with regular follow-up as recommended. Over 40 minutes  of exam, counseling, chart review and high complex critical decision making was performed.

## 2017-04-04 ENCOUNTER — Encounter: Payer: Self-pay | Admitting: Internal Medicine

## 2017-04-04 LAB — HEMOGLOBIN A1C
EAG (MMOL/L): 5.5 (calc)
Hgb A1c MFr Bld: 5.1 % of total Hgb (ref <5.7)
Mean Plasma Glucose: 100 (calc)

## 2017-04-04 LAB — URINALYSIS, ROUTINE W REFLEX MICROSCOPIC
BILIRUBIN URINE: NEGATIVE
GLUCOSE, UA: NEGATIVE
Hgb urine dipstick: NEGATIVE
KETONES UR: NEGATIVE
Leukocytes, UA: NEGATIVE
Nitrite: NEGATIVE
PROTEIN: NEGATIVE
Specific Gravity, Urine: 1.015 (ref 1.001–1.03)
pH: <=5 (ref 5.0–8.0)

## 2017-04-04 LAB — CBC WITH DIFFERENTIAL/PLATELET
BASOS PCT: 0.9 %
Basophils Absolute: 60 cells/uL (ref 0–200)
EOS PCT: 0.7 %
Eosinophils Absolute: 47 cells/uL (ref 15–500)
HCT: 42.7 % (ref 35.0–45.0)
Hemoglobin: 14.1 g/dL (ref 11.7–15.5)
LYMPHS ABS: 1802 {cells}/uL (ref 850–3900)
MCH: 29.2 pg (ref 27.0–33.0)
MCHC: 33 g/dL (ref 32.0–36.0)
MCV: 88.4 fL (ref 80.0–100.0)
MPV: 10.8 fL (ref 7.5–12.5)
Monocytes Relative: 8.5 %
NEUTROS PCT: 63 %
Neutro Abs: 4221 cells/uL (ref 1500–7800)
PLATELETS: 344 10*3/uL (ref 140–400)
RBC: 4.83 10*6/uL (ref 3.80–5.10)
RDW: 11.7 % (ref 11.0–15.0)
TOTAL LYMPHOCYTE: 26.9 %
WBC mixed population: 570 cells/uL (ref 200–950)
WBC: 6.7 10*3/uL (ref 3.8–10.8)

## 2017-04-04 LAB — BASIC METABOLIC PANEL WITH GFR
BUN: 19 mg/dL (ref 7–25)
CALCIUM: 9.3 mg/dL (ref 8.6–10.2)
CHLORIDE: 107 mmol/L (ref 98–110)
CO2: 27 mmol/L (ref 20–32)
Creat: 0.99 mg/dL (ref 0.50–1.10)
GFR, Est African American: 81 mL/min/{1.73_m2} (ref >=60)
GFR, Est Non African American: 70 mL/min/{1.73_m2} (ref >=60)
Glucose, Bld: 83 mg/dL (ref 65–99)
POTASSIUM: 4.4 mmol/L (ref 3.5–5.3)
Sodium: 141 mmol/L (ref 135–146)

## 2017-04-04 LAB — MAGNESIUM: MAGNESIUM: 2.2 mg/dL (ref 1.5–2.5)

## 2017-04-04 LAB — HEPATIC FUNCTION PANEL
AG Ratio: 2.1 (calc) (ref 1.0–2.5)
ALBUMIN MSPROF: 4.7 g/dL (ref 3.6–5.1)
ALT: 10 U/L (ref 6–29)
AST: 14 U/L (ref 10–30)
Alkaline phosphatase (APISO): 31 U/L — ABNORMAL LOW (ref 33–115)
BILIRUBIN DIRECT: 0.1 mg/dL (ref 0.0–0.2)
Globulin: 2.2 g/dL (calc) (ref 1.9–3.7)
Indirect Bilirubin: 0.2 mg/dL (calc) (ref 0.2–1.2)
TOTAL PROTEIN: 6.9 g/dL (ref 6.1–8.1)
Total Bilirubin: 0.3 mg/dL (ref 0.2–1.2)

## 2017-04-04 LAB — MICROALBUMIN / CREATININE URINE RATIO
CREATININE, URINE: 91 mg/dL (ref 20–275)
Microalb Creat Ratio: 5 mcg/mg creat (ref <30)
Microalb, Ur: 0.5 mg/dL

## 2017-04-04 LAB — IRON, TOTAL/TOTAL IRON BINDING CAP
%SAT: 22 % (calc) (ref 11–50)
IRON: 69 ug/dL (ref 40–190)
TIBC: 314 ug/dL (ref 250–450)

## 2017-04-04 LAB — LIPID PANEL
CHOLESTEROL: 165 mg/dL (ref <200)
HDL: 64 mg/dL (ref >50)
LDL Cholesterol (Calc): 82 mg/dL (calc)
Non-HDL Cholesterol (Calc): 101 mg/dL (calc) (ref <130)
TRIGLYCERIDES: 94 mg/dL (ref <150)
Total CHOL/HDL Ratio: 2.6 (calc) (ref <5.0)

## 2017-04-04 LAB — INSULIN, RANDOM: INSULIN: 9.6 u[IU]/mL (ref 2.0–19.6)

## 2017-04-04 LAB — TSH: TSH: 0.83 m[IU]/L

## 2017-04-04 LAB — VITAMIN B12: VITAMIN B 12: 606 pg/mL (ref 200–1100)

## 2017-04-04 LAB — VITAMIN D 25 HYDROXY (VIT D DEFICIENCY, FRACTURES): VIT D 25 HYDROXY: 121 ng/mL — AB (ref 30–100)

## 2017-04-08 LAB — TB SKIN TEST
INDURATION: 0 mm
TB SKIN TEST: NEGATIVE

## 2017-04-10 ENCOUNTER — Other Ambulatory Visit: Payer: Self-pay | Admitting: Obstetrics and Gynecology

## 2017-04-10 DIAGNOSIS — R928 Other abnormal and inconclusive findings on diagnostic imaging of breast: Secondary | ICD-10-CM

## 2017-04-15 ENCOUNTER — Ambulatory Visit: Payer: Self-pay

## 2017-04-15 ENCOUNTER — Ambulatory Visit
Admission: RE | Admit: 2017-04-15 | Discharge: 2017-04-15 | Disposition: A | Discharge status: Home / Self Care | Payer: BC Managed Care – PPO | Source: Ambulatory Visit | Attending: Obstetrics and Gynecology | Admitting: Obstetrics and Gynecology

## 2017-04-15 DIAGNOSIS — R928 Other abnormal and inconclusive findings on diagnostic imaging of breast: Secondary | ICD-10-CM

## 2018-02-11 ENCOUNTER — Encounter: Payer: Self-pay | Admitting: Adult Health

## 2018-02-11 ENCOUNTER — Ambulatory Visit: Payer: BC Managed Care – PPO | Admitting: Adult Health

## 2018-02-11 ENCOUNTER — Ambulatory Visit (HOSPITAL_COMMUNITY)
Admission: RE | Admit: 2018-02-11 | Discharge: 2018-02-11 | Disposition: A | Discharge status: Home / Self Care | Payer: BC Managed Care – PPO | Source: Ambulatory Visit | Attending: Adult Health | Admitting: Adult Health

## 2018-02-11 VITALS — BP 122/76 | HR 70 | Temp 97.5°F | Ht 68.0 in | Wt 152.8 lb

## 2018-02-11 DIAGNOSIS — M25522 Pain in left elbow: Secondary | ICD-10-CM

## 2018-02-11 DIAGNOSIS — W108XXA Fall (on) (from) other stairs and steps, initial encounter: Secondary | ICD-10-CM | POA: Diagnosis not present

## 2018-02-11 NOTE — Patient Instructions (Signed)

## 2018-02-11 NOTE — Progress Notes (Signed)
Assessment and Plan:  Nikeshia was seen today for arm injury.  Diagnoses and all orders for this visit:  Left elbow pain after fall Soft tissue bruising vs non-displaced fracture, will rule out with imaging Reviewed RICE with patient, recommended OTC analgesics PRN Will refer to ortho as indicated pending xray results -     DG Elbow Complete Left; Future  Further disposition pending results of labs. Discussed med's effects and SE's.   Over 15 minutes of exam, counseling, chart review, and critical decision making was performed.   Future Appointments  Date Time Provider [place]  04/03/2018  2:00 PM Liane Comber, NP [facility] None    ------------------------------------------------------------------------------------------------------------------  HPI BP 122/76   Pulse 70   Temp (!) 97.5 F (36.4 C)   Ht 5\' 8"  (1.727 m)   Wt 152 lb 12.8 oz (69.3 kg)   SpO2 99%   BMI 23.23 kg/m   42 y.o.female right handed, presents for evaluation of left forearm pain on radial side near elbow; she fell on carpeted stairs Friday night 02/07/2018 (4 days ago) and landed on her arm. Initially had pain, swelling, ecchymosis. Swelling has improved, applied ice. Hasn't taken OTC analgesics. She endorses ongoing odd "heavy" feeling and is concerned regarding possible fracture. She does have remote hx of fracture in childhood. Denies numbness, tingling; does endorse some vague localized discomfort with weight bearing.   Past Medical History:  Diagnosis Date  . Vitamin D deficiency      Allergies  Allergen Reactions  . Pneumovax [Pneumococcal Polysaccharide Vaccine] Other (See Comments)  . Sulfamethoxazole-Trimethoprim Rash    Unsure of reaction, childhood allergy     Current Outpatient Medications on File Prior to Visit  Medication Sig  . aspirin 81 MG tablet Take 81 mg by mouth daily.  . Cholecalciferol (VITAMIN D PO) Take 2,000 Units by mouth. Takes 4 daily to = 8000 units daily   . Multiple Vitamin (MULTIVITAMIN) tablet Take 1 tablet by mouth daily.   No current facility-administered medications on file prior to visit.     ROS: all negative except above.   Physical Exam:  BP 122/76   Pulse 70   Temp (!) 97.5 F (36.4 C)   Ht 5\' 8"  (1.727 m)   Wt 152 lb 12.8 oz (69.3 kg)   SpO2 99%   BMI 23.23 kg/m   General Appearance: Well nourished, in no apparent distress. Eyes: conjunctiva no swelling or erythema ENT/Mouth: Hearing normal.  Neck: Supple Respiratory: Respiratory effort normal Cardio: RRR with no MRGs. Brisk peripheral pulses without edema.  Lymphatics: Non tender without lymphadenopathy.  Musculoskeletal: Full ROM, normal gait. Bruising over left radius near elbow with echymosis, mild soft tissue edema without joint space involvement. Generalized tenderness without obvious deformity or palpable bony abnormality.  Skin: Warm, dry without rashes, lesions, ecchymosis.  Neuro: Normal muscle tone, no cerebellar symptoms. Sensation intact.  Psych: Awake and oriented X 3, normal affect, Insight and Judgment appropriate.     Izora Ribas, NP 9:06 AM [facility]

## 2018-04-03 ENCOUNTER — Encounter: Payer: Self-pay | Admitting: Adult Health

## 2018-04-07 NOTE — Progress Notes (Signed)
Complete Physical  Assessment and Plan:  Diagnoses and all orders for this visit:  Encounter for routine adult health examination without abnormal findings  Mixed hyperlipidemia Continue low cholesterol diet and exercise.  Check lipid panel.  -     Lipid panel -     TSH  Abnormal glucose -     COMPLETE METABOLIC PANEL WITH GFR -     Hemoglobin A1c -     Urinalysis, Routine w reflex microscopic -     Microalbumin / creatinine urine ratio  Vitamin D deficiency -     VITAMIN D 25 Hydroxy (Vit-D Deficiency, Fractures)  Medication management -     CBC with Differential/Platelet -     COMPLETE METABOLIC PANEL WITH GFR -     Magnesium -     Urinalysis, Routine w reflex microscopic  Screening for cardiovascular condition -     EKG 12-Lead  Anemia, unspecified type -     Vitamin B12 -     Iron,Total/Total Iron Binding Cap  BMI 22 Continue to recommend diet heavy in fruits and veggies and low in animal meats, cheeses, and dairy products, appropriate calorie intake Discuss exercise recommendations routinely Continue to monitor weight at each visit - discussed increasing water intake, and increasing fruits/vegetables   No future appointments.   HPI  42 y.o. female  presents for a complete physical and follow up for has Vitamin D deficiency; Abnormal glucose; Medication management; and Mixed hyperlipidemia on their problem list. She has no concerns today. She is followed by GYN annually.   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   BMI is Body mass index is 22.81 kg/m., she has been working on diet and exercise, she does Taekwando. She reports minimal stress, sleeps well, drinks 3 bottles of water daily. 2 cups of coffee daily. Admits to poor fruit/vegetable intake.  Wt Readings from Last 3 Encounters:  04/08/18 150 lb (68 kg)  02/11/18 152 lb 12.8 oz (69.3 kg)  04/03/17 158 lb 9.6 oz (71.9 kg)   Her blood pressure has been  controlled at home, today their BP is BP: 110/76 She does workout. She denies chest pain, shortness of breath, dizziness.   She is not on cholesterol medication and denies myalgias. Her cholesterol is at goal. The cholesterol last visit was:   Lab Results  Component Value Date   CHOL 165 04/03/2017   HDL 64 04/03/2017   LDLCALC 82 04/03/2017   TRIG 94 04/03/2017   CHOLHDL 2.6 04/03/2017   She has been working on diet and exercise for glucose management, she is on bASA and denies increased appetite, nausea, paresthesia of the feet, polydipsia and polyuria. Last A1C in the office was:  Lab Results  Component Value Date   HGBA1C 5.1 04/03/2017   Last GFR: Lab Results  Component Value Date   GFRNONAA 70 04/03/2017   Patient is on Vitamin D supplement, taking 10000 IU daily    Lab Results  Component Value Date   VD25OH 121 (H) 04/03/2017      Current Medications:  Current Outpatient Medications on File Prior to Visit  Medication Sig Dispense Refill  . aspirin 81 MG tablet Take 81 mg by mouth daily.    . Cholecalciferol (VITAMIN D PO) Take 2,000 Units by mouth. Takes 4 daily to = 8000 units daily    . Multiple Vitamin (MULTIVITAMIN) tablet Take 1 tablet by mouth daily.  No current facility-administered medications on file prior to visit.    Allergies:  Allergies  Allergen Reactions  . Pneumovax [Pneumococcal Polysaccharide Vaccine] Other (See Comments)  . Sulfamethoxazole-Trimethoprim Rash    Unsure of reaction, childhood allergy    Medical History:  She has Vitamin D deficiency; Abnormal glucose; Medication management; and Mixed hyperlipidemia on their problem list. Health Maintenance:   Immunization History  Administered Date(s) Administered  . Influenza Split 01/18/2014, 01/26/2015  . Influenza,inj,quad, With Preservative 02/23/2016  . Influenza-Unspecified 01/12/2013, 02/07/2017  . PPD Test 01/18/2014, 01/26/2015, 04/03/2017  . Pneumococcal Polysaccharide-23  01/03/2011  . Td 12/28/2009   Tetanus: 2011 Flu vaccine: 2019 Pneumonia: 2012  LMP: No LMP recorded. Pap:  2018, goes to GYN annually  MGM: 04/2017 DEXA: -  Colonoscopy: - EGD: -  Last Dental Exam: goes q53m last visit 2019 Last Eye Exam: last exam 11/2017, contacts  Patient Care Team: MUnk Pinto MD as PCP - General (Internal Medicine)  Surgical History:  She has a past surgical history that includes No past surgeries. Family History:  Herfamily history includes Arthritis in her mother; Heart attack in her maternal grandfather and paternal grandfather; Heart attack (age of onset: 525 in her maternal uncle; Lupus in her maternal grandmother; Multiple myeloma (age of onset: 647 in her father; Stroke in her maternal uncle. Social History:  She reports that she has never smoked. She has never used smokeless tobacco. She reports current alcohol use of about 7.0 standard drinks of alcohol per week. She reports that she does not use drugs.  Review of Systems: Review of Systems  Constitutional: Negative for malaise/fatigue and weight loss.  HENT: Negative for hearing loss and tinnitus.   Eyes: Negative for blurred vision and double vision.  Respiratory: Negative for cough, shortness of breath and wheezing.   Cardiovascular: Negative for chest pain, palpitations, orthopnea, claudication and leg swelling.  Gastrointestinal: Negative for abdominal pain, blood in stool, constipation, diarrhea, heartburn, melena, nausea and vomiting.  Genitourinary: Negative.   Musculoskeletal: Negative for joint pain and myalgias.  Skin: Negative for rash.  Neurological: Negative for dizziness, tingling, sensory change, weakness and headaches.  Endo/Heme/Allergies: Negative for polydipsia.  Psychiatric/Behavioral: Negative.   All other systems reviewed and are negative.   Physical Exam: Estimated body mass index is 22.81 kg/m as calculated from the following:   Height as of this encounter: 5'  8" (1.727 m).   Weight as of this encounter: 150 lb (68 kg). BP 110/76   Pulse 82   Temp 97.7 F (36.5 C)   Ht _0  (1.727 m)   Wt 150 lb (68 kg)   SpO2 98%   BMI 22.81 kg/m  General Appearance: Well nourished, in no apparent distress.  Eyes: PERRLA, EOMs, conjunctiva no swelling or erythema, normal fundi and vessels.  Sinuses: No Frontal/maxillary tenderness  ENT/Mouth: Ext aud canals clear, normal light reflex with TMs without erythema, bulging. Good dentition. No erythema, swelling, or exudate on post pharynx. Tonsils not swollen or erythematous. Hearing normal.  Neck: Supple, thyroid normal. No bruits  Respiratory: Respiratory effort normal, BS equal bilaterally without rales, rhonchi, wheezing or stridor.  Cardio: RRR without murmurs, rubs or gallops. Brisk peripheral pulses without edema.  Chest: symmetric, with normal excursions and percussion.  Breasts: Defer to GYN Abdomen: Soft, nontender, no guarding, rebound, hernias, masses, or organomegaly.  Lymphatics: Non tender without lymphadenopathy.  Genitourinary: Defer to GYN Musculoskeletal: Full ROM all peripheral extremities,5/5 strength, and normal gait.  Skin: Warm, dry without rashes,  lesions, ecchymosis. Neuro: Cranial nerves intact, reflexes equal bilaterally. Normal muscle tone, no cerebellar symptoms. Sensation intact.  Psych: Awake and oriented X 3, normal affect, Insight and Judgment appropriate.   EKG: WNL no ST changes.   Gorden Harms Md Smola 10:25 AM [facility]

## 2018-04-08 ENCOUNTER — Ambulatory Visit: Payer: BC Managed Care – PPO | Admitting: Adult Health

## 2018-04-08 ENCOUNTER — Encounter: Payer: Self-pay | Admitting: Adult Health

## 2018-04-08 VITALS — BP 110/76 | HR 82 | Temp 97.7°F | Ht 68.0 in | Wt 150.0 lb

## 2018-04-08 DIAGNOSIS — Z131 Encounter for screening for diabetes mellitus: Secondary | ICD-10-CM

## 2018-04-08 DIAGNOSIS — Z1389 Encounter for screening for other disorder: Secondary | ICD-10-CM | POA: Diagnosis not present

## 2018-04-08 DIAGNOSIS — Z1329 Encounter for screening for other suspected endocrine disorder: Secondary | ICD-10-CM | POA: Diagnosis not present

## 2018-04-08 DIAGNOSIS — I10 Essential (primary) hypertension: Secondary | ICD-10-CM

## 2018-04-08 DIAGNOSIS — Z0000 Encounter for general adult medical examination without abnormal findings: Secondary | ICD-10-CM | POA: Diagnosis not present

## 2018-04-08 DIAGNOSIS — Z136 Encounter for screening for cardiovascular disorders: Secondary | ICD-10-CM

## 2018-04-08 DIAGNOSIS — E559 Vitamin D deficiency, unspecified: Secondary | ICD-10-CM | POA: Diagnosis not present

## 2018-04-08 DIAGNOSIS — Z130 Encounter for screening for diseases of the blood and blood-forming organs and certain disorders involving the immune mechanism: Secondary | ICD-10-CM

## 2018-04-08 DIAGNOSIS — R7309 Other abnormal glucose: Secondary | ICD-10-CM

## 2018-04-08 DIAGNOSIS — D649 Anemia, unspecified: Secondary | ICD-10-CM

## 2018-04-08 DIAGNOSIS — Z79899 Other long term (current) drug therapy: Secondary | ICD-10-CM | POA: Diagnosis not present

## 2018-04-08 DIAGNOSIS — Z13220 Encounter for screening for lipoid disorders: Secondary | ICD-10-CM

## 2018-04-08 DIAGNOSIS — E782 Mixed hyperlipidemia: Secondary | ICD-10-CM

## 2018-04-08 NOTE — Patient Instructions (Addendum)
  [name] , Thank you for taking time to come for your Annual Wellness Visit. I appreciate your ongoing commitment to your health goals. Please review the following plan we discussed and let me know if I can assist you in the future.   These are the goals we discussed: Goals    . DIET - EAT MORE FRUITS AND VEGETABLES     Aim for minimum of 5 servings (1/2 cup each) - try a smoothie in the morning with frozen berries, and a handful of greens or powdered greens    . DIET - INCREASE WATER INTAKE     65+ fluid ounces daily of clear fluids       This is a list of the screening recommended for you and due dates:  Health Maintenance  Topic Date Due  . HIV Screening  04/08/2020*  . Tetanus Vaccine  12/29/2019  . Pap Smear  04/03/2020  . Flu Shot  Completed  *Topic was postponed. The date shown is not the original due date.    Know what a healthy weight is for you (roughly BMI <25) and aim to maintain this  Aim for 7+ servings of fruits and vegetables daily  65-80+ fluid ounces of water or unsweet tea for healthy kidneys  Limit to max 1 drink of alcohol per day; avoid smoking/tobacco  Limit animal fats in diet for cholesterol and heart health - choose grass fed whenever available  Avoid highly processed foods, and foods high in saturated/trans fats  Aim for low stress - take time to unwind and care for your mental health  Aim for 150 min of moderate intensity exercise weekly for heart health, and weights twice weekly for bone health  Aim for 7-9 hours of sleep daily      When it comes to diets, agreement about the perfect plan isn't easy to find, even among the experts. Experts at the [employer] developed an idea known as the Healthy Eating Plate. Just imagine a plate divided into logical, healthy portions.  The emphasis is on diet quality:  Load up on vegetables and fruits - one-half of your plate: Aim for color and variety, and remember that potatoes  don't count.  Go for whole grains - one-quarter of your plate: Whole wheat, barley, wheat berries, quinoa, oats, brown rice, and foods made with them. If you want pasta, go with whole wheat pasta.  Protein power - one-quarter of your plate: Fish, chicken, beans, and nuts are all healthy, versatile protein sources. Limit red meat.  The diet, however, does go beyond the plate, offering a few other suggestions.  Use healthy plant oils, such as olive, canola, soy, corn, sunflower and peanut. Check the labels, and avoid partially hydrogenated oil, which have unhealthy trans fats.  If you're thirsty, drink water. Coffee and tea are good in moderation, but skip sugary drinks and limit milk and dairy products to one or two daily servings.  The type of carbohydrate in the diet is more important than the amount. Some sources of carbohydrates, such as vegetables, fruits, whole grains, and beans-are healthier than others.  Finally, stay active.

## 2018-04-09 LAB — VITAMIN B12: Vitamin B-12: 464 pg/mL (ref 200–1100)

## 2018-04-09 LAB — HEMOGLOBIN A1C
Hgb A1c MFr Bld: 5 % of total Hgb (ref <5.7)
Mean Plasma Glucose: 97 (calc)
eAG (mmol/L): 5.4 (calc)

## 2018-04-09 LAB — MICROALBUMIN / CREATININE URINE RATIO
CREATININE, URINE: 31 mg/dL (ref 20–275)
Microalb Creat Ratio: 10 mcg/mg creat (ref <30)
Microalb, Ur: 0.3 mg/dL

## 2018-04-09 LAB — COMPLETE METABOLIC PANEL WITH GFR
AG Ratio: 2 (calc) (ref 1.0–2.5)
ALBUMIN MSPROF: 5.1 g/dL (ref 3.6–5.1)
ALT: 10 U/L (ref 6–29)
AST: 12 U/L (ref 10–30)
Alkaline phosphatase (APISO): 29 U/L — ABNORMAL LOW (ref 33–115)
BUN: 13 mg/dL (ref 7–25)
CO2: 27 mmol/L (ref 20–32)
Calcium: 9.7 mg/dL (ref 8.6–10.2)
Chloride: 105 mmol/L (ref 98–110)
Creat: 0.87 mg/dL (ref 0.50–1.10)
GFR, Est African American: 95 mL/min/{1.73_m2} (ref >=60)
GFR, Est Non African American: 82 mL/min/{1.73_m2} (ref >=60)
Globulin: 2.5 g/dL (calc) (ref 1.9–3.7)
Glucose, Bld: 74 mg/dL (ref 65–99)
Potassium: 4.2 mmol/L (ref 3.5–5.3)
SODIUM: 139 mmol/L (ref 135–146)
Total Bilirubin: 0.4 mg/dL (ref 0.2–1.2)
Total Protein: 7.6 g/dL (ref 6.1–8.1)

## 2018-04-09 LAB — CBC WITH DIFFERENTIAL/PLATELET
ABSOLUTE MONOCYTES: 515 {cells}/uL (ref 200–950)
BASOS PCT: 1 %
Basophils Absolute: 62 cells/uL (ref 0–200)
EOS ABS: 68 {cells}/uL (ref 15–500)
Eosinophils Relative: 1.1 %
HCT: 44.3 % (ref 35.0–45.0)
HEMOGLOBIN: 14.6 g/dL (ref 11.7–15.5)
Lymphs Abs: 1742 cells/uL (ref 850–3900)
MCH: 29.5 pg (ref 27.0–33.0)
MCHC: 33 g/dL (ref 32.0–36.0)
MCV: 89.5 fL (ref 80.0–100.0)
MONOS PCT: 8.3 %
MPV: 10.5 fL (ref 7.5–12.5)
NEUTROS ABS: 3813 {cells}/uL (ref 1500–7800)
Neutrophils Relative %: 61.5 %
Platelets: 361 10*3/uL (ref 140–400)
RBC: 4.95 10*6/uL (ref 3.80–5.10)
RDW: 11.9 % (ref 11.0–15.0)
Total Lymphocyte: 28.1 %
WBC: 6.2 10*3/uL (ref 3.8–10.8)

## 2018-04-09 LAB — URINALYSIS, ROUTINE W REFLEX MICROSCOPIC
Bilirubin Urine: NEGATIVE
Glucose, UA: NEGATIVE
Hgb urine dipstick: NEGATIVE
Ketones, ur: NEGATIVE
Leukocytes, UA: NEGATIVE
Nitrite: NEGATIVE
PROTEIN: NEGATIVE
Specific Gravity, Urine: 1.008 (ref 1.001–1.03)
pH: 5.5 (ref 5.0–8.0)

## 2018-04-09 LAB — VITAMIN D 25 HYDROXY (VIT D DEFICIENCY, FRACTURES): Vit D, 25-Hydroxy: 97 ng/mL (ref 30–100)

## 2018-04-09 LAB — IRON, TOTAL/TOTAL IRON BINDING CAP
%SAT: 52 % (calc) — ABNORMAL HIGH (ref 16–45)
Iron: 187 ug/dL (ref 40–190)
TIBC: 363 mcg/dL (calc) (ref 250–450)

## 2018-04-09 LAB — LIPID PANEL
Cholesterol: 193 mg/dL (ref <200)
HDL: 78 mg/dL (ref >50)
LDL Cholesterol (Calc): 94 mg/dL (calc)
Non-HDL Cholesterol (Calc): 115 mg/dL (calc) (ref <130)
Total CHOL/HDL Ratio: 2.5 (calc) (ref <5.0)
Triglycerides: 118 mg/dL (ref <150)

## 2018-04-09 LAB — TSH: TSH: 1.47 m[IU]/L

## 2018-04-09 LAB — MAGNESIUM: Magnesium: 2.3 mg/dL (ref 1.5–2.5)

## 2018-04-22 ENCOUNTER — Encounter: Payer: Self-pay | Admitting: Internal Medicine

## 2019-01-19 ENCOUNTER — Ambulatory Visit (INDEPENDENT_AMBULATORY_CARE_PROVIDER_SITE_OTHER): Payer: BC Managed Care – PPO | Admitting: *Deleted

## 2019-01-19 ENCOUNTER — Other Ambulatory Visit: Payer: Self-pay

## 2019-01-19 VITALS — Temp 97.8°F

## 2019-01-19 DIAGNOSIS — Z23 Encounter for immunization: Secondary | ICD-10-CM
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
## 2019-04-09 ENCOUNTER — Encounter: Payer: Self-pay | Admitting: Adult Health

## 2019-04-09 ENCOUNTER — Other Ambulatory Visit: Payer: Self-pay

## 2019-04-09 ENCOUNTER — Ambulatory Visit (INDEPENDENT_AMBULATORY_CARE_PROVIDER_SITE_OTHER): Payer: BC Managed Care – PPO | Admitting: Adult Health

## 2019-04-09 VITALS — BP 122/78 | HR 77 | Temp 97.5°F | Ht 68.0 in | Wt 158.0 lb

## 2019-04-09 DIAGNOSIS — Z0000 Encounter for general adult medical examination without abnormal findings: Secondary | ICD-10-CM | POA: Diagnosis not present

## 2019-04-09 DIAGNOSIS — Z1329 Encounter for screening for other suspected endocrine disorder: Secondary | ICD-10-CM | POA: Diagnosis not present

## 2019-04-09 DIAGNOSIS — J45909 Unspecified asthma, uncomplicated: Secondary | ICD-10-CM

## 2019-04-09 DIAGNOSIS — Z6824 Body mass index (BMI) 24.0-24.9, adult: Secondary | ICD-10-CM

## 2019-04-09 DIAGNOSIS — Z1389 Encounter for screening for other disorder: Secondary | ICD-10-CM | POA: Diagnosis not present

## 2019-04-09 DIAGNOSIS — Z79899 Other long term (current) drug therapy: Secondary | ICD-10-CM | POA: Diagnosis not present

## 2019-04-09 DIAGNOSIS — Z131 Encounter for screening for diabetes mellitus: Secondary | ICD-10-CM | POA: Diagnosis not present

## 2019-04-09 DIAGNOSIS — E782 Mixed hyperlipidemia: Secondary | ICD-10-CM

## 2019-04-09 DIAGNOSIS — E538 Deficiency of other specified B group vitamins: Secondary | ICD-10-CM

## 2019-04-09 DIAGNOSIS — Z13220 Encounter for screening for lipoid disorders: Secondary | ICD-10-CM

## 2019-04-09 DIAGNOSIS — E559 Vitamin D deficiency, unspecified: Secondary | ICD-10-CM | POA: Diagnosis not present

## 2019-04-09 DIAGNOSIS — Z130 Encounter for screening for diseases of the blood and blood-forming organs and certain disorders involving the immune mechanism: Secondary | ICD-10-CM

## 2019-04-09 DIAGNOSIS — R7309 Other abnormal glucose: Secondary | ICD-10-CM

## 2019-04-09 HISTORY — DX: Unspecified asthma, uncomplicated: J45.909

## 2019-04-09 NOTE — Patient Instructions (Addendum)
Abigail Hicks , Thank you for taking time to come for your Annual Wellness Visit. I appreciate your ongoing commitment to your health goals. Please review the following plan we discussed and let me know if I can assist you in the future.   These are the goals we discussed: Goals    . DIET - EAT MORE FRUITS AND VEGETABLES     Aim for minimum of 5 servings (1/2 cup each) - try a smoothie in the morning with frozen berries, and a handful of greens or powdered greens    . Exercise 150 min/wk Moderate Activity     Aim for 20+ min daily of intentional activity; walking or body weight exercises - enough to get heart rate up and break a sweat       This is a list of the screening recommended for you and due dates:  Health Maintenance  Topic Date Due  . HIV Screening  04/08/2020*  . Tetanus Vaccine  12/29/2019  . Pap Smear  04/03/2020  . Flu Shot  Completed  *Topic was postponed. The date shown is not the original due date.     [employer] with no obligation # [phone] Do not have to be a member Tues-Sat 10-6  [place]- free test with no obligation # [phone] MUST BE A MEMBER Call for store hours  Have had patient's get good cheaper hearing aids from [facility] The air version has good reviews.     Know what a healthy weight is for you (roughly BMI <25) and aim to maintain this  Aim for 7+ servings of fruits and vegetables daily  65-80+ fluid ounces of water or unsweet tea for healthy kidneys  Limit to max 1 drink of alcohol per day; avoid smoking/tobacco  Limit animal fats in diet for cholesterol and heart health - choose grass fed whenever available  Avoid highly processed foods, and foods high in saturated/trans fats  Aim for low stress - take time to unwind and care for your mental health  Aim for 150 min of moderate intensity exercise weekly for heart health, and weights twice weekly for bone health  Aim for 7-9 hours of sleep  daily         Exercising to Stay Healthy To become healthy and stay healthy, it is recommended that you do moderate-intensity and vigorous-intensity exercise. You can tell that you are exercising at a moderate intensity if your heart starts beating faster and you start breathing faster but can still hold a conversation. You can tell that you are exercising at a vigorous intensity if you are breathing much harder and faster and cannot hold a conversation while exercising. Exercising regularly is important. It has many health benefits, such as:  Improving overall fitness, flexibility, and endurance.  Increasing bone density.  Helping with weight control.  Decreasing body fat.  Increasing muscle strength.  Reducing stress and tension.  Improving overall health. How often should I exercise? Choose an activity that you enjoy, and set realistic goals. Your health care provider can help you make an activity plan that works for you. Exercise regularly as told by your health care provider. This may include:  Doing strength training two times a week, such as: ? Lifting weights. ? Using resistance bands. ? Push-ups. ? Sit-ups. ? Yoga.  Doing a certain intensity of exercise for a given amount of time. Choose from these options: ? A total of 150 minutes of moderate-intensity exercise every  week. ? A total of 75 minutes of vigorous-intensity exercise every week. ? A mix of moderate-intensity and vigorous-intensity exercise every week. Children, pregnant women, people who have not exercised regularly, people who are overweight, and older adults may need to talk with a health care provider about what activities are safe to do. If you have a medical condition, be sure to talk with your health care provider before you start a new exercise program. What are some exercise ideas? Moderate-intensity exercise ideas include:  Walking 1 mile (1.6 km) in about 15  minutes.  Biking.  Hiking.  Golfing.  Dancing.  Water aerobics. Vigorous-intensity exercise ideas include:  Walking 4.5 miles (7.2 km) or more in about 1 hour.  Jogging or running 5 miles (8 km) in about 1 hour.  Biking 10 miles (16.1 km) or more in about 1 hour.  Lap swimming.  Roller-skating or in-line skating.  Cross-country skiing.  Vigorous competitive sports, such as football, basketball, and soccer.  Jumping rope.  Aerobic dancing. What are some everyday activities that can help me to get exercise?  [place] work, such as: ? Pushing a Conservation officer, nature. ? Raking and bagging leaves.  Washing your car.  Pushing a stroller.  Shoveling snow.  Gardening.  Washing windows or floors. How can I be more active in my day-to-day activities?  Use stairs instead of an elevator.  Take a walk during your lunch break.  If you drive, park your car farther away from your work or school.  If you take public transportation, get off one stop early and walk the rest of the way.  Stand up or walk around during all of your indoor phone calls.  Get up, stretch, and walk around every 30 minutes throughout the day.  Enjoy exercise with a friend. Support to continue exercising will help you keep a regular routine of activity. What guidelines can I follow while exercising?  Before you start a new exercise program, talk with your health care provider.  Do not exercise so much that you hurt yourself, feel dizzy, or get very short of breath.  Wear comfortable clothes and wear shoes with good support.  Drink plenty of water while you exercise to prevent dehydration or heat stroke.  Work out until your breathing and your heartbeat get faster. Where to find more information  U.S. Department of Health and Human Services: [employer]at  Centers for Disease Control and Prevention (CDC): http://www.wolf.info/ Summary  Exercising regularly is important. It will improve your overall fitness,  flexibility, and endurance.  Regular exercise also will improve your overall health. It can help you control your weight, reduce stress, and improve your bone density.  Do not exercise so much that you hurt yourself, feel dizzy, or get very short of breath.  Before you start a new exercise program, talk with your health care provider. This information is not intended to replace advice given to you by your health care provider. Make sure you discuss any questions you have with your health care provider. Document Revised: 03/08/2017 Document Reviewed: 02/14/2017 Elsevier Patient Education  2020 [employer].

## 2019-04-10 LAB — TSH: TSH: 1.16 mIU/L

## 2019-04-10 LAB — URINALYSIS, ROUTINE W REFLEX MICROSCOPIC
Bacteria, UA: NONE SEEN /HPF
Bilirubin Urine: NEGATIVE
Glucose, UA: NEGATIVE
Hgb urine dipstick: NEGATIVE
Hyaline Cast: NONE SEEN /LPF
Ketones, ur: NEGATIVE
Nitrite: NEGATIVE
Protein, ur: NEGATIVE
RBC / HPF: NONE SEEN /HPF (ref 0–2)
Specific Gravity, Urine: 1.008 (ref 1.001–1.03)
Squamous Epithelial / HPF: NONE SEEN /HPF (ref <=5)
pH: 7.5 (ref 5.0–8.0)

## 2019-04-10 LAB — HEMOGLOBIN A1C
Hgb A1c MFr Bld: 4.9 % of total Hgb (ref <5.7)
Mean Plasma Glucose: 94 (calc)
eAG (mmol/L): 5.2 (calc)

## 2019-04-10 LAB — CBC WITH DIFFERENTIAL/PLATELET
Absolute Monocytes: 502 cells/uL (ref 200–950)
Basophils Absolute: 63 cells/uL (ref 0–200)
Basophils Relative: 1.1 %
Eosinophils Absolute: 63 cells/uL (ref 15–500)
Eosinophils Relative: 1.1 %
HCT: 42.1 % (ref 35.0–45.0)
Hemoglobin: 14.1 g/dL (ref 11.7–15.5)
Lymphs Abs: 1630 cells/uL (ref 850–3900)
MCH: 30.1 pg (ref 27.0–33.0)
MCHC: 33.5 g/dL (ref 32.0–36.0)
MCV: 89.8 fL (ref 80.0–100.0)
MPV: 10.3 fL (ref 7.5–12.5)
Monocytes Relative: 8.8 %
Neutro Abs: 3443 cells/uL (ref 1500–7800)
Neutrophils Relative %: 60.4 %
Platelets: 342 10*3/uL (ref 140–400)
RBC: 4.69 10*6/uL (ref 3.80–5.10)
RDW: 11.7 % (ref 11.0–15.0)
Total Lymphocyte: 28.6 %
WBC: 5.7 10*3/uL (ref 3.8–10.8)

## 2019-04-10 LAB — COMPLETE METABOLIC PANEL WITH GFR
AG Ratio: 1.8 (calc) (ref 1.0–2.5)
ALT: 11 U/L (ref 6–29)
AST: 15 U/L (ref 10–30)
Albumin: 4.8 g/dL (ref 3.6–5.1)
Alkaline phosphatase (APISO): 26 U/L — ABNORMAL LOW (ref 31–125)
BUN: 12 mg/dL (ref 7–25)
CO2: 28 mmol/L (ref 20–32)
Calcium: 9.6 mg/dL (ref 8.6–10.2)
Chloride: 104 mmol/L (ref 98–110)
Creat: 0.77 mg/dL (ref 0.50–1.10)
GFR, Est African American: 109 mL/min/{1.73_m2} (ref >=60)
GFR, Est Non African American: 94 mL/min/{1.73_m2} (ref >=60)
Globulin: 2.6 g/dL (calc) (ref 1.9–3.7)
Glucose, Bld: 87 mg/dL (ref 65–99)
Potassium: 4.1 mmol/L (ref 3.5–5.3)
Sodium: 138 mmol/L (ref 135–146)
Total Bilirubin: 0.5 mg/dL (ref 0.2–1.2)
Total Protein: 7.4 g/dL (ref 6.1–8.1)

## 2019-04-10 LAB — LIPID PANEL
Cholesterol: 185 mg/dL (ref <200)
HDL: 77 mg/dL (ref >=50)
LDL Cholesterol (Calc): 90 mg/dL (calc)
Non-HDL Cholesterol (Calc): 108 mg/dL (calc) (ref <130)
Total CHOL/HDL Ratio: 2.4 (calc) (ref <5.0)
Triglycerides: 89 mg/dL (ref <150)

## 2019-04-10 LAB — VITAMIN D 25 HYDROXY (VIT D DEFICIENCY, FRACTURES): Vit D, 25-Hydroxy: 79 ng/mL (ref 30–100)

## 2019-04-10 LAB — MAGNESIUM: Magnesium: 2.2 mg/dL (ref 1.5–2.5)

## 2019-04-10 LAB — VITAMIN B12: Vitamin B-12: 561 pg/mL (ref 200–1100)

## 2019-05-19 DIAGNOSIS — N390 Urinary tract infection, site not specified: Secondary | ICD-10-CM | POA: Insufficient documentation

## 2019-05-19 DIAGNOSIS — J45909 Unspecified asthma, uncomplicated: Secondary | ICD-10-CM | POA: Diagnosis present

## 2020-04-11 ENCOUNTER — Encounter: Payer: BC Managed Care – PPO | Admitting: Adult Health

## 2020-06-15 IMAGING — CR DG ELBOW COMPLETE 3+V*L*
4 series · 4 of 4 positions shown · non-contrast
Comparison: None.

CLINICAL DATA: Fell down steps 4 days ago. Left elbow pain and
tenderness. Initial encounter.

EXAM:
LEFT ELBOW - COMPLETE 3+ VIEW

[elbow ap]
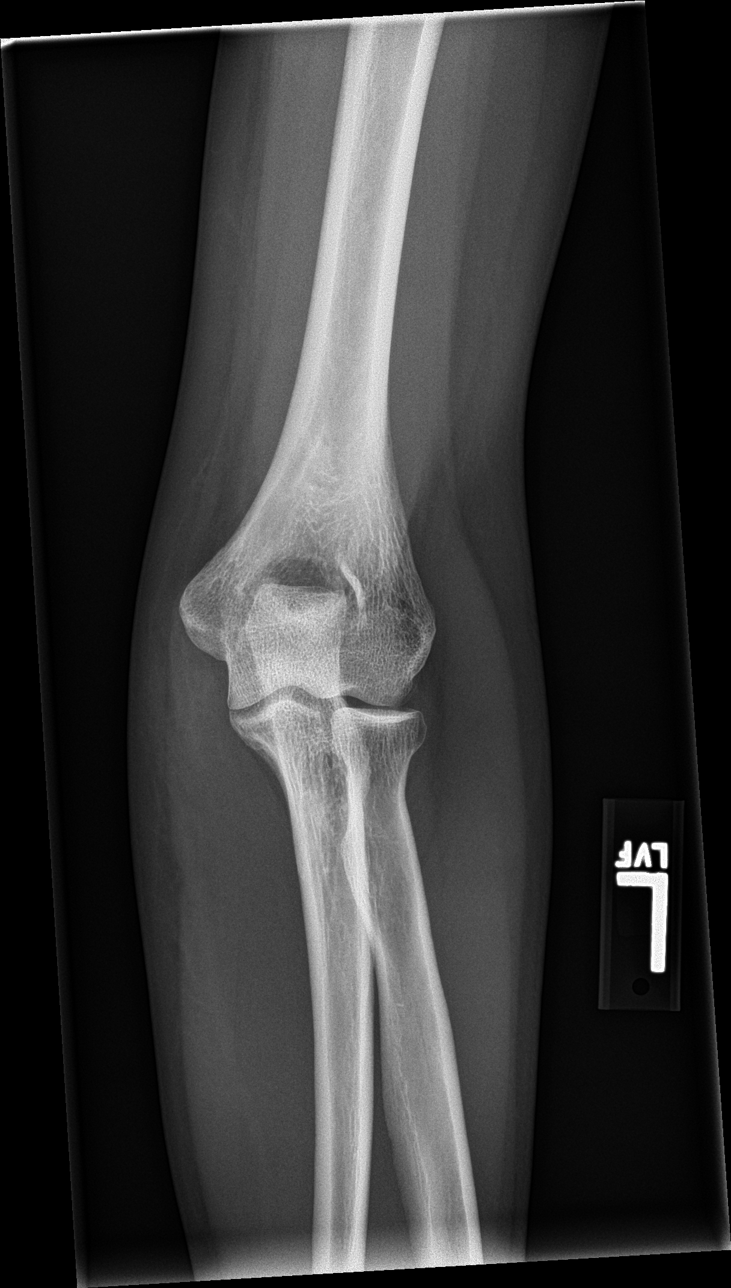

[elbow obl (1 of 2)]
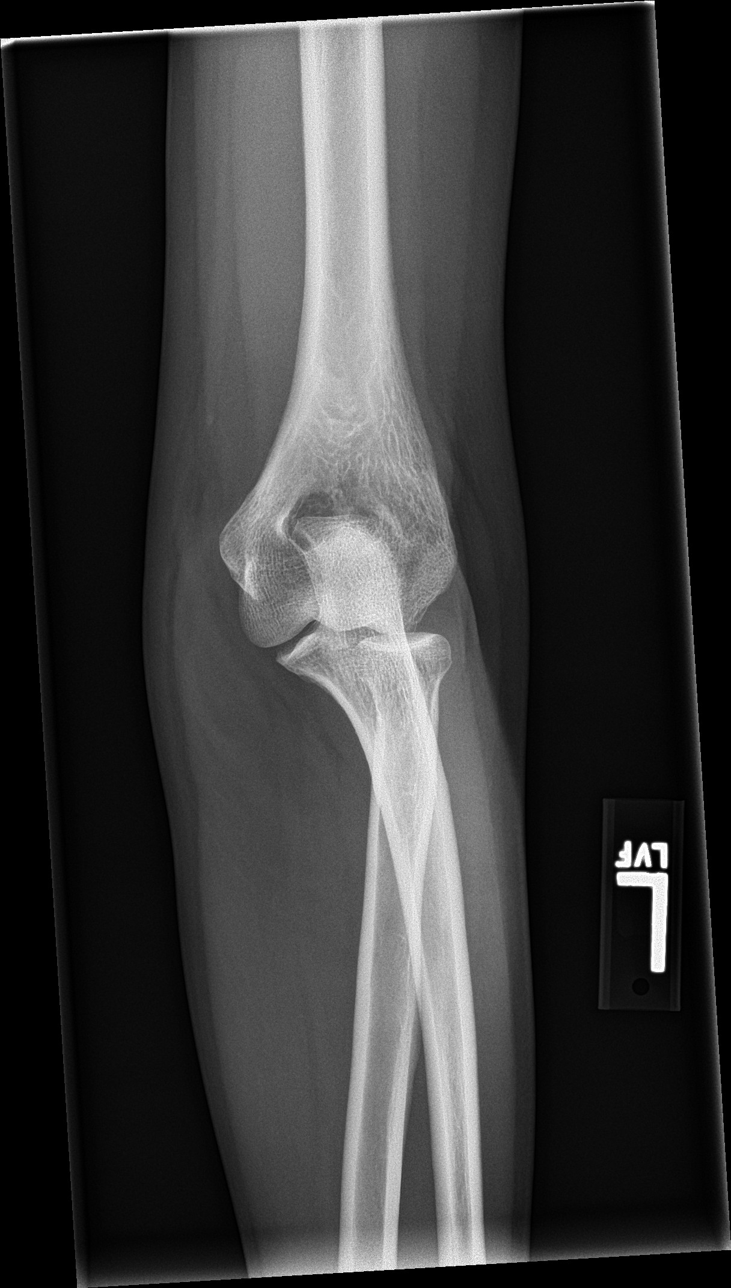

[elbow lat]
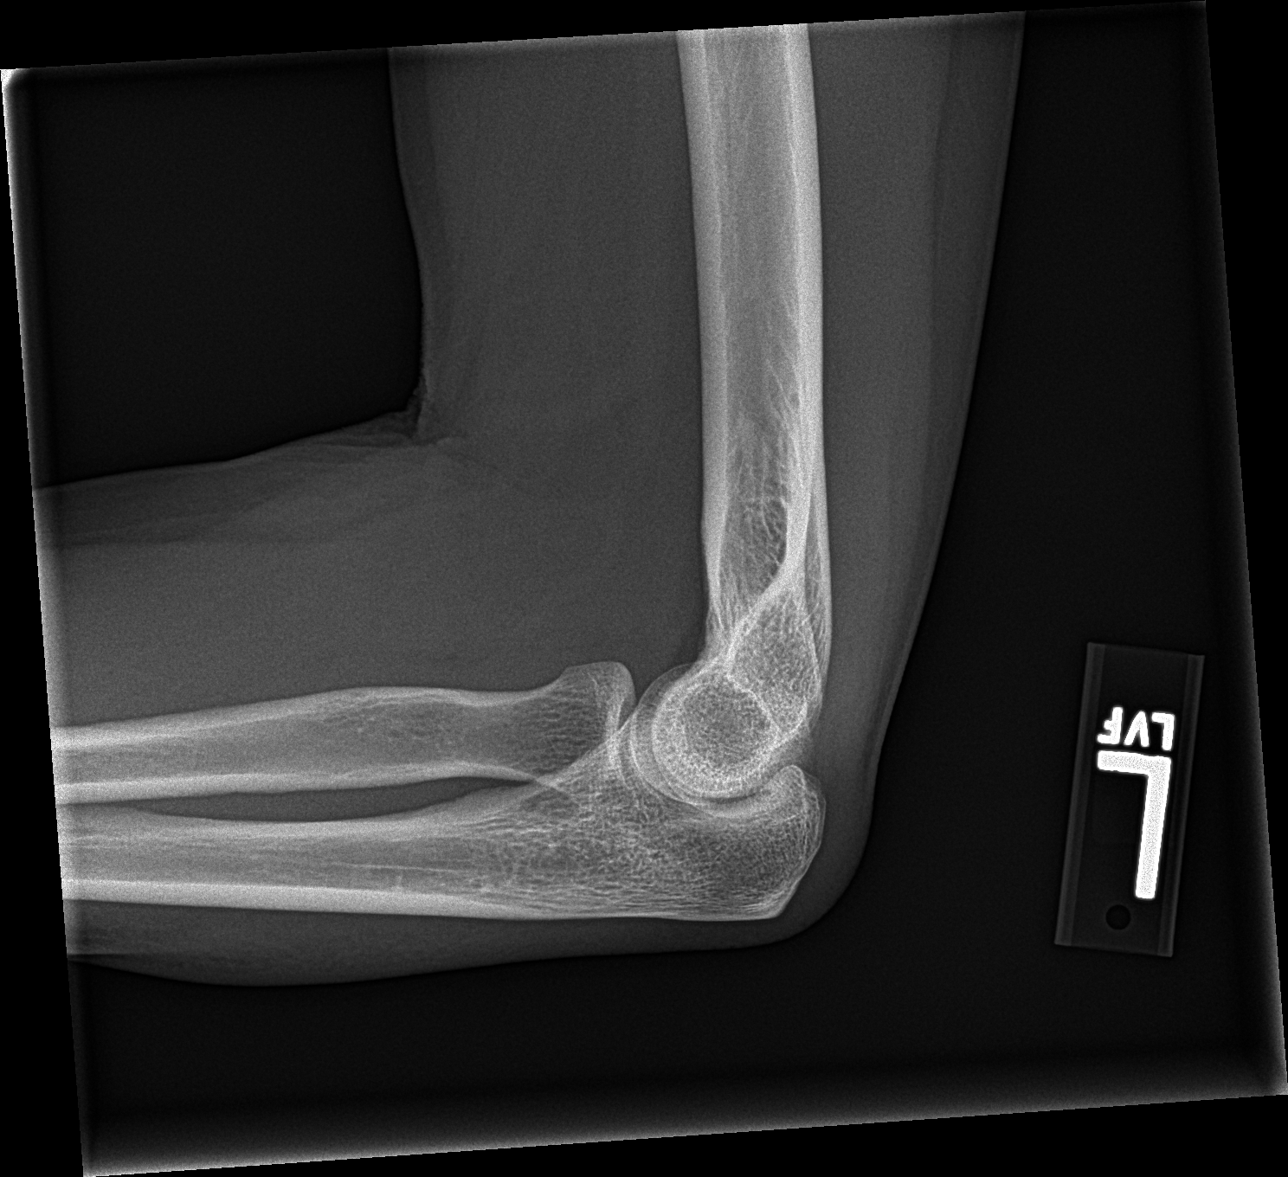

[elbow obl (2 of 2)]
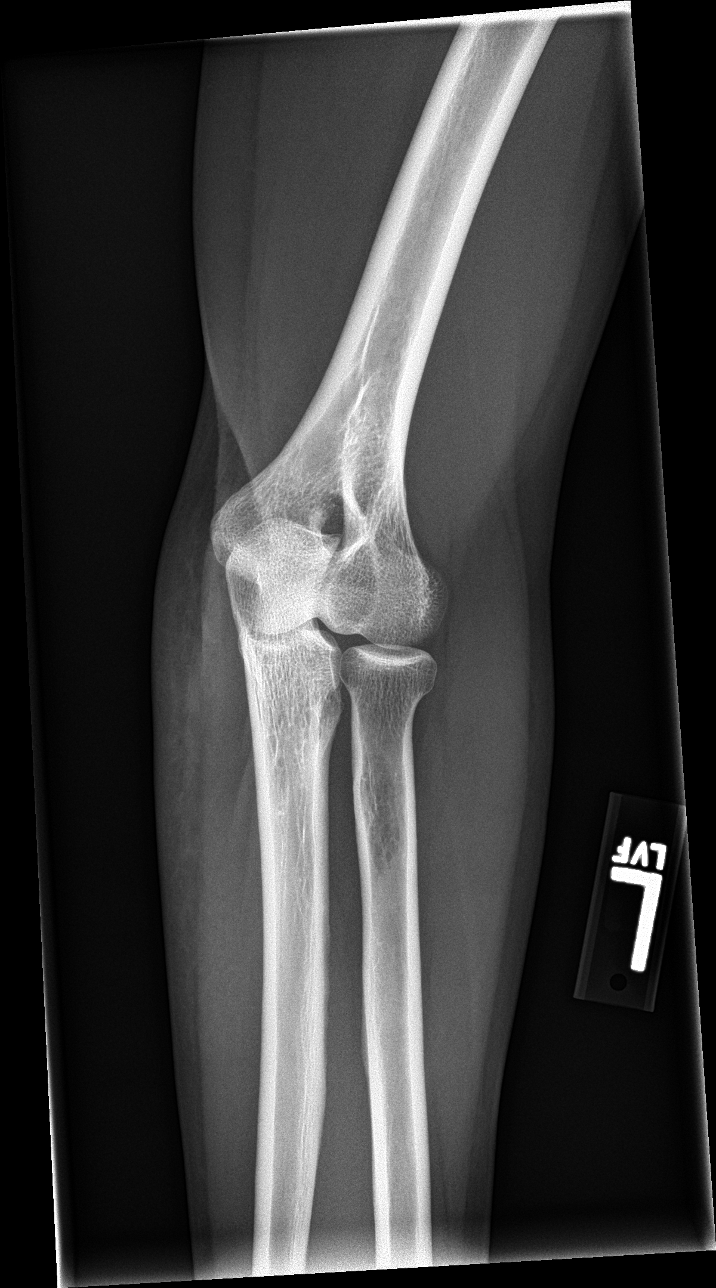

[4 of 4 positions shown; findings below may reference images not displayed]

FINDINGS: There is no evidence of fracture, dislocation, or joint effusion.
There is no evidence of arthropathy or other focal bone abnormality.
Soft tissues are unremarkable.
IMPRESSION: Negative.

## 2024-01-21 ENCOUNTER — Other Ambulatory Visit: Payer: Self-pay

## 2024-01-21 ENCOUNTER — Encounter: Payer: Self-pay | Admitting: Gynecologic Oncology

## 2024-01-21 ENCOUNTER — Inpatient Hospital Stay
Admission: RE | Admit: 2024-01-21 | Discharge: 2024-01-21 | Disposition: A | Discharge status: Home / Self Care | Payer: Self-pay | Source: Ambulatory Visit | Attending: Gynecologic Oncology | Admitting: Gynecologic Oncology

## 2024-01-21 DIAGNOSIS — R109 Unspecified abdominal pain: Secondary | ICD-10-CM

## 2024-01-22 ENCOUNTER — Encounter: Payer: Self-pay | Admitting: Gynecologic Oncology

## 2024-01-22 NOTE — Progress Notes (Unsigned)
 GYNECOLOGIC ONCOLOGY NEW PATIENT CONSULTATION   Patient Name: Abigail Hicks  Patient Age: 48 y.o. Date of Service: 01/23/24 Referring Provider: Refugio Sing, FNP [id number] [place],  [place] [address]   Primary Care Provider: [name] Consulting Provider: Comer Dollar, MD   Assessment/Plan:  Premenopausal patient with a large pelvic mass.  Reviewed recent symptoms with more significant symptoms starting in the last couple of weeks.  Discussed that this is likely related to the size of her pelvic mass.  Reviewed CT images with her.  Given size of this mass as well as symptoms, I recommend surgery for both diagnostic and therapeutic purposes.  Discussed possible etiologies including benign mass, borderline tumor, or malignancy.  Plan for tumor markers today.  Given age as well as abnormal uterine bleeding, will obtain inhibin B as well as tumor markers for sex cord stromal and germ cell tumors.  Discussed utility of endometrial biopsy today versus at the time of surgery.  Reviewed that pathology results for surgery would help with surgical planning.  Patient is worried about the cost of the procedure.  Given plan for OR, I think it is reasonable to perform endometrial biopsy at the start of surgery and sent this for frozen section.  Abnormal bleeding may be related to perimenopausal/hormonal changes, but discussed that abnormal bleeding can sometimes occur in the setting of pelvic mass especially if this is a mass that is producing estrogen.  We discussed plan for unilateral oophorectomy and bilateral salpingectomy.  Could either do this through a midline laparotomy given the size of the mass or began with diagnostic laparoscopy.  If tumor markers are reassuring and findings at the time of diagnostic laparoscopy do not suggest a malignant process, could consider mini laparotomy for controlled cyst drainage versus proceeding with laparotomy.   Plan would be to send  the mass for frozen section, if benign, discussed decision with regard to contralateral ovary as well as her uterus and cervix.  In the setting of a borderline tumor, discussed given her age recommendation for BSO to decrease her risk of recurrence, +/- total hysterectomy, staging including peritoneal biopsies and omentectomy.  If ovarian cancer is seen on frozen section, discussed possible addition of lymphadenectomy depending on histology.  The patient would like some additional time to think about her contralateral ovary and uterus but is leaning towards removal of all pelvic organs at the time of surgery.  She is leaning towards larger incision but will plan for diagnostic laparoscopy and if tumor markers reassuring and findings at the time of diagnostic laparoscopy are reassuring, will consider mini lap for controlled cyst drainage and robotic surgery versus open  We reviewed the plan for a diagnostic laparoscopy, open vs robotic assisted unilateral salpingo-oophorectomy, contralateral salpingectomy, possible contralateral oophorectomy, possible total hysterectomy, possible staging. The risks of surgery were discussed in detail and she understands these to include infection; wound separation; hernia; vaginal cuff separation, injury to adjacent organs such as bowel, bladder, blood vessels, ureters and nerves; bleeding which may require blood transfusion; anesthesia risk; thromboembolic events; possible death; unforeseen complications; possible need for re-exploration; medical complications such as heart attack, stroke, pleural effusion and pneumonia; and, if full lymphadenectomy is performed the risk of lymphedema and lymphocyst. The patient will receive DVT and antibiotic prophylaxis as indicated. She voiced a clear understanding. She had the opportunity to ask questions. Perioperative instructions were reviewed with her. Prescriptions for post-op medications were sent to her pharmacy of choice.  I  suspect her constipation is related to the size of her pelvic mass.  Discussed bowel regimen today.  Due for cervical cancer screening, Pap and HPV collected.  Asked her to talk to her sister who was diagnosed with breast cancer at age 24 regarding any genetic testing that she had.  A copy of this note was sent to the patient's referring provider.   75 minutes of total time was spent for this patient encounter, including preparation, face-to-face counseling with the patient and coordination of care, and documentation of the encounter.  Comer Dollar, MD  Division of Gynecologic Oncology  Department of Obstetrics and Gynecology  [facility]  ___________________________________________  Chief Complaint: Chief Complaint  Patient presents with   Pelvic mass    History of Present Illness:  Abigail Hicks is a 48 y.o. y.o. female who is seen in consultation at the request of Refugio Sing, FNP for an evaluation of a pelvic mass.  The patient was seen in early October with abdominal pain and constipation beginning in June. Also has noted an abdominal mass, increasing in size. She has AUB, bleeding almost daily.   CT A/P on 01/17/24 revealed: 1. 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 Ascites, concerning for malignancy  3. Right hydroureteronephrosis to the level of the pelvic mass compatible with obstruction  4. Left ovary thought present in left anterior pelvis and also contains multiple cysts, largest 2.8 cm  5. Fecal retention  6. Cholelithiasis  7. Other findings as above   Today, the patient notes that in March 2024, her husband says she began having intermittent complaints like pain.  About 6 months ago, she began having 2 menses a month.  Denies any intermenstrual bleeding.  In June or July, she had a brief sharp pain in her right side that resolved without intervention.  Approximately 2-4  weeks ago, she began noticing abdominal discomfort and distention, decreased appetite, early satiety, and back pain.  She also has intermittent constipation and diarrhea or loose stools, which is not her baseline.  She also reports some urinary urge incontinence.  Patient endorses having good energy.  She has been working on losing weight.  Weighed 176 pounds 1-2 years ago, has been 155 pounds for the last few months.  PAST MEDICAL HISTORY:  Past Medical History:  Diagnosis Date   Asthma 04/09/2019   no medications since 20s   Vitamin D  deficiency      PAST SURGICAL HISTORY:  Past Surgical History:  Procedure Laterality Date   NO PAST SURGERIES      OB/GYN HISTORY:  OB History  Gravida Para Term Preterm AB Living  3 2      SAB IAB Ectopic Multiple Live Births          # Outcome Date GA Lbr Len/2nd Weight Sex Type Anes PTL Lv  3 Gravida           2 Para           1 Para             No LMP recorded.  Age at menarche: 76  Hx of STDs: denies Last pap: Reports a history of an abnormal Pap in her teens treated with cryotherapy.  Notes having normal Pap smears subsequently, last in 2020 History of abnormal pap smears: see above  SCREENING STUDIES:  Last mammogram: 2020   MEDICATIONS: Outpatient Encounter Medications as of 01/23/2024  Medication  Sig   Multiple Vitamin (MULTIVITAMIN) tablet Take 1 tablet by mouth daily.   senna-docusate (SENOKOT-S) 8.6-50 MG tablet Take 1-2 tablets by mouth at bedtime. Do not take if having diarrhea. Can start with one tablet initially.   traMADol (ULTRAM) 50 MG tablet Take 1 tablet (50 mg total) by mouth every 6 (six) hours as needed for moderate pain (pain score 4-6). For AFTER surgery only, do not take and drive   [DISCONTINUED] Ascorbic Acid (VITAMIN C) 500 MG CHEW Chew by mouth.   [DISCONTINUED] aspirin 81 MG tablet Take 81 mg by mouth daily.   [DISCONTINUED] Cholecalciferol (VITAMIN D  PO) Take 5,000 Units by mouth. Takes 4 daily to =  8000 units daily    No facility-administered encounter medications on file as of 01/23/2024.    ALLERGIES:  Allergies  Allergen Reactions   Pneumovax [Pneumococcal Polysaccharide Vaccine] Other (See Comments)   Sulfamethoxazole-Trimethoprim Rash    Unsure of reaction, childhood allergy      FAMILY HISTORY:  Family History  Problem Relation Age of Onset   Arthritis Mother        Rhumatoid   Multiple myeloma Father 14   Breast cancer Sister    Heart attack Maternal Uncle 33   Stroke Maternal Uncle    Lupus Maternal Grandmother    Heart attack Maternal Grandfather    Heart attack Paternal Grandfather    Colon cancer Neg Hx    Ovarian cancer Neg Hx    Endometrial cancer Neg Hx    Pancreatic cancer Neg Hx    Prostate cancer Neg Hx      SOCIAL HISTORY:  Social Connections: Not on file    REVIEW OF SYSTEMS:  + Appetite changes, abdominal distention, pain, constipation, diarrhea, dyspareunia, frequency, incontinence, menstrual problems, pelvic pain, vaginal discharge, back pain Denies fevers, chills, fatigue, unexplained weight changes. Denies hearing loss, neck lumps or masses, mouth sores, ringing in ears or voice changes. Denies cough or wheezing.  Denies shortness of breath. Denies chest pain or palpitations. Denies leg swelling. Denies blood in stools, nausea, vomiting, or early satiety. Denies dysuria, hematuria. Denies hot flashes.   Denies joint pain or muscle pain/cramps. Denies itching, rash, or wounds. Denies dizziness, headaches, numbness or seizures. Denies swollen lymph nodes or glands, denies easy bruising or bleeding. Denies anxiety, depression, confusion, or decreased concentration.  Physical Exam:  Vital Signs for this encounter:  Blood pressure 120/78, pulse 82, temperature 98 F (36.7 C), temperature source Oral, resp. rate 19, height 5' 8 (1.727 m), weight 155 lb 9.6 oz (70.6 kg), SpO2 100%. Body mass index is 23.66 kg/m. General: Alert,  oriented, no acute distress.  HEENT: Normocephalic, atraumatic. Sclera anicteric.  Chest: Clear to auscultation bilaterally. No wheezes, rhonchi, or rales. Cardiovascular: Regular rate and rhythm, no murmurs, rubs, or gallops.  Abdomen: Normoactive bowel sounds. Soft, nondistended, nontender to palpation.  Mass palpated that spans 1-2 cm above the umbilicus, mobile, fills the pelvis. No palpable fluid wave.  Extremities: Grossly normal range of motion. Warm, well perfused. No edema bilaterally.  Skin: No rashes or lesions.  Lymphatics: No cervical, supraclavicular, or inguinal adenopathy.  GU:  Normal external female genitalia. No lesions. No discharge or bleeding.             Bladder/urethra:  No lesions or masses, well supported bladder             Vagina: Well-rugated, no lesions.             Cervix: Normal  appearing.  Ectropion on posterior lip of the cervix.  Pap and HPV collected.             Uterus: Small, mobile, no parametrial involvement or nodularity.             Adnexa: Large mass fills the cul-de-sac, smooth, mobile.  With abdominal hands, mass spans just above the umbilicus.  Rectal: Deferred.  LABORATORY AND RADIOLOGIC DATA:  Outside medical records were reviewed to synthesize the above history, along with the history and physical obtained during the visit.   Lab Results  Component Value Date   WBC 5.7 04/09/2019   HGB 14.1 04/09/2019   HCT 42.1 04/09/2019   PLT 342 04/09/2019   GLUCOSE 87 04/09/2019   CHOL 185 04/09/2019   TRIG 89 04/09/2019   HDL 77 04/09/2019   LDLCALC 90 04/09/2019   ALT 11 04/09/2019   AST 15 04/09/2019   NA 138 04/09/2019   K 4.1 04/09/2019   CL 104 04/09/2019   CREATININE 0.77 04/09/2019   BUN 12 04/09/2019   CO2 28 04/09/2019   TSH 1.16 04/09/2019   HGBA1C 4.9 04/09/2019   MICROALBUR 0.3 04/08/2018

## 2024-01-23 ENCOUNTER — Inpatient Hospital Stay: Admitting: Gynecologic Oncology

## 2024-01-23 ENCOUNTER — Inpatient Hospital Stay: Payer: Self-pay | Attending: Gynecologic Oncology | Admitting: Gynecologic Oncology

## 2024-01-23 ENCOUNTER — Encounter: Payer: Self-pay | Admitting: Gynecologic Oncology

## 2024-01-23 ENCOUNTER — Inpatient Hospital Stay

## 2024-01-23 ENCOUNTER — Ambulatory Visit: Payer: Self-pay | Admitting: Gynecologic Oncology

## 2024-01-23 VITALS — BP 120/78 | HR 82 | Temp 98.0°F | Resp 19 | Ht 68.0 in | Wt 155.6 lb

## 2024-01-23 DIAGNOSIS — Z1151 Encounter for screening for human papillomavirus (HPV): Secondary | ICD-10-CM | POA: Insufficient documentation

## 2024-01-23 DIAGNOSIS — R630 Anorexia: Secondary | ICD-10-CM | POA: Diagnosis not present

## 2024-01-23 DIAGNOSIS — K5909 Other constipation: Secondary | ICD-10-CM

## 2024-01-23 DIAGNOSIS — Z807 Family history of other malignant neoplasms of lymphoid, hematopoietic and related tissues: Secondary | ICD-10-CM | POA: Diagnosis not present

## 2024-01-23 DIAGNOSIS — E559 Vitamin D deficiency, unspecified: Secondary | ICD-10-CM | POA: Diagnosis not present

## 2024-01-23 DIAGNOSIS — R1900 Intra-abdominal and pelvic swelling, mass and lump, unspecified site: Secondary | ICD-10-CM

## 2024-01-23 DIAGNOSIS — R197 Diarrhea, unspecified: Secondary | ICD-10-CM | POA: Insufficient documentation

## 2024-01-23 DIAGNOSIS — Z803 Family history of malignant neoplasm of breast: Secondary | ICD-10-CM | POA: Diagnosis not present

## 2024-01-23 DIAGNOSIS — N898 Other specified noninflammatory disorders of vagina: Secondary | ICD-10-CM | POA: Diagnosis not present

## 2024-01-23 DIAGNOSIS — Z124 Encounter for screening for malignant neoplasm of cervix: Secondary | ICD-10-CM | POA: Insufficient documentation

## 2024-01-23 DIAGNOSIS — R109 Unspecified abdominal pain: Secondary | ICD-10-CM | POA: Diagnosis not present

## 2024-01-23 DIAGNOSIS — N941 Unspecified dyspareunia: Secondary | ICD-10-CM | POA: Insufficient documentation

## 2024-01-23 DIAGNOSIS — K5900 Constipation, unspecified: Secondary | ICD-10-CM | POA: Diagnosis not present

## 2024-01-23 DIAGNOSIS — N939 Abnormal uterine and vaginal bleeding, unspecified: Secondary | ICD-10-CM | POA: Diagnosis not present

## 2024-01-23 DIAGNOSIS — R6881 Early satiety: Secondary | ICD-10-CM | POA: Insufficient documentation

## 2024-01-23 DIAGNOSIS — N3941 Urge incontinence: Secondary | ICD-10-CM | POA: Insufficient documentation

## 2024-01-23 LAB — CEA (ACCESS): CEA (CHCC): 15.51 ng/mL — ABNORMAL HIGH (ref 0.00–5.00)

## 2024-01-23 LAB — LACTATE DEHYDROGENASE: LDH: 169 U/L (ref 98–192)

## 2024-01-23 MED ORDER — SENNOSIDES-DOCUSATE SODIUM 8.6-50 MG PO TABS: 1.0000 | ORAL_TABLET | Freq: Every day | ORAL | 3 refills | Status: DC

## 2024-01-23 MED ORDER — TRAMADOL HCL 50 MG PO TABS: 50.0000 mg | ORAL_TABLET | Freq: Four times a day (QID) | ORAL | 0 refills | Status: AC | PRN

## 2024-01-23 NOTE — Patient Instructions (Addendum)
 Preparing for your Surgery  Plan for surgery on January 30, 2024 with Dr. Comer Dollar at [facility]. You will be scheduled for endometrial biopsy, robotic assisted laparoscopic versus open bilateral salpingectomy (removal of both fallopian tubes), unilateral oophorectomy (removal of one ovary), possible bilateral salpingo-oophorectomy (removal of both ovaries and fallopian tubes), possible total hysterectomy (removal of the uterus and cervix), possible staging if a precancer or cancer is seen.   You will be contacted with the results of your pap smear from today.   We will reach out to our financial team to get an estimate of cost for laparoscopic surgery versus open (expect 1-3 night stay in the hospital).  Plan to start taking sennakot-S now. You can start with one tablet at bedtime and increase to two. Do not take if having diarrhea.  P.lan to start weaning off ibuprofen now. Do not take for at least 3 days before surgery.  Pre-operative Testing -You will receive a phone call from presurgical testing at [facility] to arrange for a pre-operative appointment and lab work.  -Bring your insurance card, copy of an advanced directive if applicable, medication list  -At that visit, you will be asked to sign a consent for a possible blood transfusion in case a transfusion becomes necessary during surgery.  The need for a blood transfusion is rare but having consent is a necessary part of your care.     -You should not be taking blood thinners or aspirin at least ten days prior to surgery unless instructed by your surgeon.  -Do not take supplements such as fish oil (omega 3), red yeast rice, turmeric before your surgery. STOP TAKING AT LEAST 10 DAYS BEFORE SURGERY. You want to avoid medications with aspirin in them including headache powders such as BC or Goody's), Excedrin migraine.  -If you are taking a GLP-1 medication/injection such as Ozempic, Mounjaro, [id number], this  needs to be held before surgery for at least 7 days before.  Day Before Surgery at Home -You will be asked to take in a light diet the day before surgery. You will be advised you can have clear liquids up until 3 hours before your surgery.    Eat a light diet the day before surgery.  Examples including soups, broths, toast, yogurt, mashed potatoes.  AVOID GAS PRODUCING FOODS AND BEVERAGES. Things to avoid include carbonated beverages (fizzy beverages, sodas), raw fruits and raw vegetables (uncooked), or beans.   If your bowels are filled with gas, your surgeon will have difficulty visualizing your pelvic organs which increases your surgical risks.  Your role in recovery Your role is to become active as soon as directed by your doctor, while still giving yourself time to heal.  Rest when you feel tired. You will be asked to do the following in order to speed your recovery:  - Cough and breathe deeply. This helps to clear and expand your lungs and can prevent pneumonia after surgery.  - STAY ACTIVE WHEN YOU GET HOME. Do mild physical activity. Walking or moving your legs help your circulation and body functions return to normal. Do not try to get up or walk alone the first time after surgery.   -If you develop swelling on one leg or the other, pain in the back of your leg, redness/warmth in one of your legs, please call the office or go to the Emergency Room to have a doppler to rule out a blood clot. For shortness of breath, chest pain-seek care in  the Emergency Room as soon as possible. - Actively manage your pain. Managing your pain lets you move in comfort. We will ask you to rate your pain on a scale of zero to 10. It is your responsibility to tell your doctor or nurse where and how much you hurt so your pain can be treated.  Special Considerations -If you are diabetic, you may be placed on insulin  after surgery to have closer control over your blood sugars to promote healing and recovery.  This  does not mean that you will be discharged on insulin .  If applicable, your oral antidiabetics will be resumed when you are tolerating a solid diet.  -Your final pathology results from surgery should be available around one week after surgery and the results will be relayed to you when available.  -FMLA forms can be faxed to [phone] and please allow 5-7 business days for completion.  Pain Management After Surgery -You will be prescribed your pain medication and bowel regimen medications before surgery so that you can have these available when you are discharged from the hospital. The pain medication is for use ONLY AFTER surgery and a new prescription will not be given.   -Make sure that you have Tylenol and Ibuprofen IF YOU ARE ABLE TO TAKE THESE MEDICATIONS at home to use on a regular basis after surgery for pain control. We recommend alternating the medications every hour to six hours since they work differently and are processed in the body differently for pain relief.  -Review the attached handout on narcotic use and their risks and side effects.   Bowel Regimen -You will be prescribed Sennakot-S to take nightly to prevent constipation especially if you are taking the narcotic pain medication intermittently.  It is important to prevent constipation and drink adequate amounts of liquids. You can stop taking this medication when you are not taking pain medication and you are back on your normal bowel routine.  Risks of Surgery Risks of surgery are low but include bleeding, infection, damage to surrounding structures, re-operation, blood clots, and very rarely death.   Blood Transfusion Information (For the consent to be signed before surgery)  We will be checking your blood type before surgery so in case of emergencies, we will know what type of blood you would need.                                            WHAT IS A BLOOD TRANSFUSION?  A transfusion is the replacement of blood or  some of its parts. Blood is made up of multiple cells which provide different functions. Red blood cells carry oxygen and are used for blood loss replacement. White blood cells fight against infection. Platelets control bleeding. Plasma helps clot blood. Other blood products are available for specialized needs, such as hemophilia or other clotting disorders. BEFORE THE TRANSFUSION  Who gives blood for transfusions?  You may be able to donate blood to be used at a later date on yourself (autologous donation). Relatives can be asked to donate blood. This is generally not any safer than if you have received blood from a stranger. The same precautions are taken to ensure safety when a relative's blood is donated. Healthy volunteers who are fully evaluated to make sure their blood is safe. This is blood bank blood. Transfusion therapy is the safest it has ever been in  the practice of medicine. Before blood is taken from a donor, a complete history is taken to make sure that person has no history of diseases nor engages in risky social behavior (examples are intravenous drug use or sexual activity with multiple partners). The donor's travel history is screened to minimize risk of transmitting infections, such as malaria. The donated blood is tested for signs of infectious diseases, such as HIV and hepatitis. The blood is then tested to be sure it is compatible with you in order to minimize the chance of a transfusion reaction. If you or a relative donates blood, this is often done in anticipation of surgery and is not appropriate for emergency situations. It takes many days to process the donated blood. RISKS AND COMPLICATIONS Although transfusion therapy is very safe and saves many lives, the main dangers of transfusion include:  Getting an infectious disease. Developing a transfusion reaction. This is an allergic reaction to something in the blood you were given. Every precaution is taken to prevent  this. The decision to have a blood transfusion has been considered carefully by your caregiver before blood is given. Blood is not given unless the benefits outweigh the risks.  AFTER SURGERY INSTRUCTIONS  Return to work: 4-6 weeks if applicable  Activity: 1. Be up and out of the bed during the day.  Take a nap if needed.  You may walk up steps but be careful and use the hand rail.  Stair climbing will tire you more than you think, you may need to stop part way and rest.   2. No lifting or straining for 6 weeks over 10 pounds. No pushing, pulling, straining for 6 weeks.  3. No driving for 4-89 days when the following criteria have been met: Do not drive if you are taking narcotic pain medicine and make sure that your reaction time has returned.   4. You can shower as soon as the next day after surgery. Shower daily.  Use your regular soap and water (not directly on the incision) and pat your incision(s) dry afterwards; don't rub.  No tub baths or submerging your body in water until cleared by your surgeon. If you have the soap that was given to you by pre-surgical testing that was used before surgery, you do not need to use it afterwards because this can irritate your incisions.   5. No sexual activity and nothing in the vagina for 6 weeks, 12 weeks if you have a hysterectomy (removal of the uterus and cervix).  6. You may experience a small amount of clear drainage from your incisions, which is normal.  If the drainage persists, increases, or changes color please call the office.  7. Do not use creams, lotions, or ointments such as neosporin on your incisions after surgery until advised by your surgeon because they can cause removal of the dermabond glue on your incisions.    8. You may experience vaginal spotting after surgery or when the stitches at the top of the vagina begin to dissolve (if you have a hysterectomy).  The spotting is normal but if you experience heavy bleeding, call our  office.  9. Take Tylenol or ibuprofen first for pain if you are able to take these medications and only use narcotic pain medication for severe pain not relieved by the Tylenol or Ibuprofen.  Monitor your Tylenol intake to a max of 4,000 mg in a 24 hour period. You can alternate these medications after surgery.  Diet: 1. Low sodium  Heart Healthy Diet is recommended but you are cleared to resume your normal (before surgery) diet after your procedure.  2. It is safe to use a laxative, such as Miralax or Colace, if you have difficulty moving your bowels before surgery. You have been prescribed Sennakot-S to take at bedtime every evening after surgery to keep bowel movements regular and to prevent constipation.    Wound Care: 1. Keep clean and dry.  Shower daily.  Reasons to call the Doctor: Fever - Oral temperature greater than 100.4 degrees Fahrenheit Foul-smelling vaginal discharge Difficulty urinating Nausea and vomiting Increased pain at the site of the incision that is unrelieved with pain medicine. Difficulty breathing with or without chest pain New calf pain especially if only on one side Sudden, continuing increased vaginal bleeding with or without clots.   Contacts: For questions or concerns you should contact:  Dr. Comer Dollar at [phone]  Eleanor Epps, NP at [phone]  After Hours: call [phone] and have the GYN Oncologist paged/contacted (after 5 pm or on the weekends). You will speak with an after hours RN and let he or she know you have had surgery.  Messages sent via mychart are for non-urgent matters and are not responded to after hours so for urgent needs, please call the after hours number.

## 2024-01-24 ENCOUNTER — Telehealth: Payer: Self-pay | Admitting: Oncology

## 2024-01-24 ENCOUNTER — Telehealth: Payer: Self-pay | Admitting: Gastroenterology

## 2024-01-24 ENCOUNTER — Encounter: Payer: Self-pay | Admitting: Oncology

## 2024-01-24 ENCOUNTER — Telehealth: Payer: Self-pay | Admitting: Gynecologic Oncology

## 2024-01-24 DIAGNOSIS — R1900 Intra-abdominal and pelvic swelling, mass and lump, unspecified site: Secondary | ICD-10-CM

## 2024-01-24 LAB — BETA HCG QUANT (REF LAB): hCG Quant: <1 m[IU]/mL

## 2024-01-24 LAB — CA 125: Cancer Antigen (CA) 125: 64.3 U/mL — ABNORMAL HIGH (ref 0.0–38.1)

## 2024-01-24 LAB — INHIBIN B: Inhibin B: <7 pg/mL

## 2024-01-24 LAB — AFP TUMOR MARKER: AFP, Serum, Tumor Marker: 5.4 ng/mL (ref 0.0–6.4)

## 2024-01-24 NOTE — Progress Notes (Signed)
 Urgent referral placed to [facility] per Dr. Viktoria.

## 2024-01-24 NOTE — Telephone Encounter (Signed)
 Called [facility] with urgent referral for a colonoscopy due to elevated CEA.  They needs to review it with Dr. Armbruster before they can schedule.  Advised Dr. Armbruster can call Dr. Viktoria directly to discuss if needed.

## 2024-01-24 NOTE — Telephone Encounter (Signed)
 Called Abigail Hicks and advised her that surgery has been rescheduled to 01/29/2024.  Advised we will call bed placement on Monday morning and will call her once a bed is available.  She verbalized understanding and agreement.

## 2024-01-24 NOTE — Telephone Encounter (Signed)
 I have discussed this case with Dr. Viktoria. Patient will be admitted on Monday to Gyn-Onc Service. Our Inpatient [facility] team will be consulted on Monday. Our team will see and place bowel preparation orders on Monday. I will perform Inpatient Colonoscopy on patient on Tuesday. Endoscopy at [facility] already aware and will create a slot for colonoscopy on Tuesday. Thanks. GM

## 2024-01-24 NOTE — Telephone Encounter (Signed)
 Called patient to discuss tumor markers.  Both CEA and CA125 are elevated, degree of elevation would favor a GI primary over an ovarian 1.  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  Given tumor markers, I reached out to Dr. Wilhelmenia regarding urgent GI referral and colonoscopy.  He very graciously was able to work the patient in for colonoscopy next Tuesday in the inpatient setting.  I let the patient know that plan would be to admit her on Monday and plan to do her bowel prep inpatient with colonoscopy on Tuesday.  We will work to move her surgery from Thursday to Wednesday.  Patient amenable.  Comer Dollar MD Gynecologic Oncology

## 2024-01-24 NOTE — Telephone Encounter (Signed)
 Good afternoon Dr. Wilhelmenia,   We received a call from [facility] regarding urgent referral for pelvic mass. Requesting a urgent colonoscopy. Please review and advise on scheduling further.   Thank you

## 2024-01-24 NOTE — Progress Notes (Signed)
 Patient here for a pre-operative appointment prior to her scheduled surgery on 10/23. She is scheduled for a diagnostic laparoscopy, robotic versus open unilateral oophorectomy, bilateral salpingectomy, possible contralateral oophorectomy, possible total hysterectomy, possible staging, and any other indicated procedures including bowel surgery.  The surgery was discussed in detail.  See after visit summary for additional details. Visual aids used to discuss items related to surgery including the incentive spirometer, sequential compression stockings, foley catheter, IV pump, multi-modal pain regimen including tylenol, photo of the surgical robot, female reproductive system to discuss surgery in detail.      Discussed post-op pain management in detail including the aspects of the enhanced recovery pathway.  Advised her that a new prescription would be sent in for tramadol and it is only to be used for after her upcoming surgery.  We discussed the use of tylenol post-op and to monitor for a maximum of 4,000 mg in a 24 hour period.  Also prescribed sennakot to be used after surgery and to hold if having loose stools.  Discussed bowel regimen in detail.     Discussed the use of SCDs and measures to take at home to prevent DVT including frequent mobility.  Reportable signs and symptoms of DVT discussed. Post-operative instructions discussed and expectations for after surgery. Incisional care discussed as well including reportable signs and symptoms including erythema, drainage, wound separation.     15 minutes spent with the patient.  Verbalizing understanding of material discussed. No needs or concerns voiced at the end of the visit.   Advised patient to call for any needs.  Advised that her post-operative medications had been prescribed and could be picked up at any time.    This appointment is included in the global surgical bundle as pre-operative teaching and has no charge.

## 2024-01-26 ENCOUNTER — Emergency Department (HOSPITAL_COMMUNITY)

## 2024-01-26 ENCOUNTER — Encounter (HOSPITAL_COMMUNITY): Payer: Self-pay

## 2024-01-26 ENCOUNTER — Other Ambulatory Visit: Payer: Self-pay

## 2024-01-26 ENCOUNTER — Inpatient Hospital Stay (HOSPITAL_COMMUNITY)
Admission: EM | Admit: 2024-01-26 | Discharge: 2024-01-31 | DRG: 357 | Disposition: A | Discharge status: Home / Self Care | Attending: Internal Medicine | Admitting: Internal Medicine

## 2024-01-26 DIAGNOSIS — R739 Hyperglycemia, unspecified: Secondary | ICD-10-CM | POA: Diagnosis present

## 2024-01-26 DIAGNOSIS — C187 Malignant neoplasm of sigmoid colon: Secondary | ICD-10-CM | POA: Diagnosis not present

## 2024-01-26 DIAGNOSIS — R1900 Intra-abdominal and pelvic swelling, mass and lump, unspecified site: Secondary | ICD-10-CM | POA: Diagnosis not present

## 2024-01-26 DIAGNOSIS — Z882 Allergy status to sulfonamides status: Secondary | ICD-10-CM

## 2024-01-26 DIAGNOSIS — C7800 Secondary malignant neoplasm of unspecified lung: Secondary | ICD-10-CM | POA: Diagnosis present

## 2024-01-26 DIAGNOSIS — D75839 Thrombocytosis, unspecified: Secondary | ICD-10-CM | POA: Diagnosis present

## 2024-01-26 DIAGNOSIS — K5902 Outlet dysfunction constipation: Secondary | ICD-10-CM | POA: Diagnosis present

## 2024-01-26 DIAGNOSIS — K8020 Calculus of gallbladder without cholecystitis without obstruction: Secondary | ICD-10-CM | POA: Diagnosis present

## 2024-01-26 DIAGNOSIS — Z881 Allergy status to other antibiotic agents status: Secondary | ICD-10-CM

## 2024-01-26 DIAGNOSIS — K6389 Other specified diseases of intestine: Secondary | ICD-10-CM | POA: Diagnosis present

## 2024-01-26 DIAGNOSIS — D509 Iron deficiency anemia, unspecified: Secondary | ICD-10-CM | POA: Diagnosis present

## 2024-01-26 DIAGNOSIS — R1030 Lower abdominal pain, unspecified: Secondary | ICD-10-CM | POA: Diagnosis not present

## 2024-01-26 DIAGNOSIS — D75838 Other thrombocytosis: Secondary | ICD-10-CM | POA: Diagnosis not present

## 2024-01-26 DIAGNOSIS — K56609 Unspecified intestinal obstruction, unspecified as to partial versus complete obstruction: Secondary | ICD-10-CM

## 2024-01-26 DIAGNOSIS — R188 Other ascites: Secondary | ICD-10-CM | POA: Diagnosis present

## 2024-01-26 DIAGNOSIS — C7963 Secondary malignant neoplasm of bilateral ovaries: Secondary | ICD-10-CM | POA: Diagnosis present

## 2024-01-26 DIAGNOSIS — Z79899 Other long term (current) drug therapy: Secondary | ICD-10-CM

## 2024-01-26 DIAGNOSIS — J45909 Unspecified asthma, uncomplicated: Secondary | ICD-10-CM | POA: Diagnosis present

## 2024-01-26 DIAGNOSIS — R1084 Generalized abdominal pain: Principal | ICD-10-CM

## 2024-01-26 DIAGNOSIS — R978 Other abnormal tumor markers: Secondary | ICD-10-CM

## 2024-01-26 DIAGNOSIS — Z800 Family history of malignant neoplasm of digestive organs: Secondary | ICD-10-CM

## 2024-01-26 DIAGNOSIS — E559 Vitamin D deficiency, unspecified: Secondary | ICD-10-CM | POA: Diagnosis present

## 2024-01-26 DIAGNOSIS — Z887 Allergy status to serum and vaccine status: Secondary | ICD-10-CM

## 2024-01-26 DIAGNOSIS — N1330 Unspecified hydronephrosis: Secondary | ICD-10-CM | POA: Diagnosis present

## 2024-01-26 DIAGNOSIS — N9489 Other specified conditions associated with female genital organs and menstrual cycle: Secondary | ICD-10-CM

## 2024-01-26 DIAGNOSIS — R933 Abnormal findings on diagnostic imaging of other parts of digestive tract: Secondary | ICD-10-CM

## 2024-01-26 DIAGNOSIS — K5900 Constipation, unspecified: Secondary | ICD-10-CM | POA: Diagnosis present

## 2024-01-26 DIAGNOSIS — K5669 Other partial intestinal obstruction: Secondary | ICD-10-CM | POA: Diagnosis present

## 2024-01-26 DIAGNOSIS — E782 Mixed hyperlipidemia: Secondary | ICD-10-CM | POA: Diagnosis present

## 2024-01-26 DIAGNOSIS — N838 Other noninflammatory disorders of ovary, fallopian tube and broad ligament: Secondary | ICD-10-CM | POA: Diagnosis not present

## 2024-01-26 DIAGNOSIS — Q438 Other specified congenital malformations of intestine: Secondary | ICD-10-CM

## 2024-01-26 DIAGNOSIS — Z8249 Family history of ischemic heart disease and other diseases of the circulatory system: Secondary | ICD-10-CM

## 2024-01-26 DIAGNOSIS — K641 Second degree hemorrhoids: Secondary | ICD-10-CM | POA: Diagnosis present

## 2024-01-26 DIAGNOSIS — R970 Elevated carcinoembryonic antigen [CEA]: Secondary | ICD-10-CM

## 2024-01-26 LAB — COMPREHENSIVE METABOLIC PANEL WITH GFR
ALT: <5 U/L (ref 0–44)
AST: 24 U/L (ref 15–41)
Albumin: 4 g/dL (ref 3.5–5.0)
Alkaline Phosphatase: 53 U/L (ref 38–126)
Anion gap: 13 (ref 5–15)
BUN: 10 mg/dL (ref 6–20)
CO2: 23 mmol/L (ref 22–32)
Calcium: 9.2 mg/dL (ref 8.9–10.3)
Chloride: 106 mmol/L (ref 98–111)
Creatinine, Ser: 0.72 mg/dL (ref 0.44–1.00)
GFR, Estimated: >60 mL/min (ref >60)
Glucose, Bld: 103 mg/dL — ABNORMAL HIGH (ref 70–99)
Potassium: 3.7 mmol/L (ref 3.5–5.1)
Sodium: 141 mmol/L (ref 135–145)
Total Bilirubin: 0.4 mg/dL (ref 0.0–1.2)
Total Protein: 7 g/dL (ref 6.5–8.1)

## 2024-01-26 LAB — CBC
HCT: 40.7 % (ref 36.0–46.0)
Hemoglobin: 12.7 g/dL (ref 12.0–15.0)
MCH: 26 pg (ref 26.0–34.0)
MCHC: 31.2 g/dL (ref 30.0–36.0)
MCV: 83.2 fL (ref 80.0–100.0)
Platelets: 586 K/uL — ABNORMAL HIGH (ref 150–400)
RBC: 4.89 MIL/uL (ref 3.87–5.11)
RDW: 12.4 % (ref 11.5–15.5)
WBC: 8.6 K/uL (ref 4.0–10.5)
nRBC: 0 % (ref 0.0–0.2)

## 2024-01-26 LAB — URINALYSIS, ROUTINE W REFLEX MICROSCOPIC
Bilirubin Urine: NEGATIVE
Glucose, UA: NEGATIVE mg/dL
Hgb urine dipstick: NEGATIVE
Ketones, ur: 5 mg/dL — AB
Nitrite: NEGATIVE
Protein, ur: NEGATIVE mg/dL
Specific Gravity, Urine: 1.023 (ref 1.005–1.030)
pH: 5 (ref 5.0–8.0)

## 2024-01-26 LAB — LIPASE, BLOOD: Lipase: 14 U/L (ref 11–51)

## 2024-01-26 LAB — HCG, SERUM, QUALITATIVE: Preg, Serum: NEGATIVE

## 2024-01-26 MED ORDER — PANTOPRAZOLE SODIUM 40 MG IV SOLR
40.0000 mg | Freq: Once | INTRAVENOUS | Status: AC
  Administered 2024-01-26: 40 mg via INTRAVENOUS
  Filled 2024-01-26: qty 10

## 2024-01-26 MED ORDER — MORPHINE SULFATE (PF) 4 MG/ML IV SOLN
4.0000 mg | Freq: Once | INTRAVENOUS | Status: AC
  Administered 2024-01-26: 4 mg via INTRAVENOUS
  Filled 2024-01-26: qty 1

## 2024-01-26 MED ORDER — ONDANSETRON HCL 4 MG PO TABS: 4.0000 mg | ORAL_TABLET | Freq: Four times a day (QID) | ORAL | Status: DC | PRN

## 2024-01-26 MED ORDER — SODIUM CHLORIDE 0.9 % IV SOLN: INTRAVENOUS | Status: AC

## 2024-01-26 MED ORDER — SODIUM CHLORIDE 0.9 % IV BOLUS
1000.0000 mL | Freq: Once | INTRAVENOUS | Status: AC
  Administered 2024-01-26: 1000 mL via INTRAVENOUS

## 2024-01-26 MED ORDER — KETOROLAC TROMETHAMINE 30 MG/ML IJ SOLN
30.0000 mg | Freq: Once | INTRAMUSCULAR | Status: AC
  Administered 2024-01-26: 30 mg via INTRAVENOUS
  Filled 2024-01-26: qty 1

## 2024-01-26 MED ORDER — ACETAMINOPHEN 650 MG RE SUPP: 650.0000 mg | Freq: Four times a day (QID) | RECTAL | Status: DC | PRN

## 2024-01-26 MED ORDER — OXYCODONE HCL 5 MG PO TABS
5.0000 mg | ORAL_TABLET | ORAL | Status: DC | PRN
  Administered 2024-01-27: 5 mg via ORAL
  Filled 2024-01-26: qty 1

## 2024-01-26 MED ORDER — POLYETHYLENE GLYCOL 3350 17 G PO PACK
17.0000 g | PACK | Freq: Four times a day (QID) | ORAL | Status: DC
  Administered 2024-01-26 – 2024-01-27 (×7): 17 g via ORAL
  Filled 2024-01-26 (×5): qty 1

## 2024-01-26 MED ORDER — HYDROMORPHONE HCL 1 MG/ML IJ SOLN
0.5000 mg | INTRAMUSCULAR | Status: AC | PRN
  Administered 2024-01-26 – 2024-01-27 (×3): 0.5 mg via INTRAVENOUS
  Filled 2024-01-26 (×3): qty 0.5

## 2024-01-26 MED ORDER — SMOG ENEMA
400.0000 mL | Freq: Once | RECTAL | Status: AC
  Administered 2024-01-26: 400 mL via RECTAL
  Filled 2024-01-26: qty 960

## 2024-01-26 MED ORDER — ONDANSETRON HCL 4 MG/2ML IJ SOLN
4.0000 mg | Freq: Four times a day (QID) | INTRAMUSCULAR | Status: DC | PRN
Start: 2024-01-26 — End: 2024-01-31
  Administered 2024-01-26 – 2024-01-28 (×4): 4 mg via INTRAVENOUS
  Filled 2024-01-26 (×4): qty 2

## 2024-01-26 MED ORDER — KETOROLAC TROMETHAMINE 15 MG/ML IJ SOLN: 15.0000 mg | Freq: Four times a day (QID) | INTRAMUSCULAR | Status: AC | PRN

## 2024-01-26 MED ORDER — ACETAMINOPHEN 325 MG PO TABS
650.0000 mg | ORAL_TABLET | Freq: Four times a day (QID) | ORAL | Status: DC | PRN
  Administered 2024-01-28 – 2024-01-30 (×2): 650 mg via ORAL
  Filled 2024-01-26 (×2): qty 2

## 2024-01-26 MED ORDER — IOHEXOL 300 MG/ML  SOLN
100.0000 mL | Freq: Once | INTRAMUSCULAR | Status: AC | PRN
  Administered 2024-01-26: 100 mL via INTRAVENOUS

## 2024-01-26 MED ORDER — OXYCODONE-ACETAMINOPHEN 5-325 MG PO TABS: 1.0000 | ORAL_TABLET | Freq: Four times a day (QID) | ORAL | 0 refills | Status: DC | PRN

## 2024-01-26 NOTE — H&P (Addendum)
 History and Physical    Patient: Abigail Hicks FMW:[id number] DOB: [birth year] DOA: 01/26/2024 DOS: the patient was seen and examined on 01/26/2024 PCP: Nena Rosina CROME, PA-C  Patient coming from: Home  Chief Complaint:  Chief Complaint  Patient presents with   Abdominal Pain   HPI: Abigail Hicks is a 48 y.o. female with medical history significant of acute urinary retention episode, asthma, vitamin D  deficiency who presented to the emergency department complaints of abdominal pain constipation.  She is scheduled to be admitted tomorrow by GYN oncology (Dr. Comer Dollar) for bilateral ovarian masses further evaluation and laparoscopic procedure versus robotic assisted salpingo-oophorectomy, contralateral oophorectomy and possible total hysterectomy, but presented this morning to the emergency department with 3-day history of constipation and sudden onset of abdominal pain (she felt like the mass went downward) associated with constipation for the past 3 days and had to come to the emergency department.  Her paternal grandfather has a history of colon cancer.  No nausea, emesis, diarrhea,  melena or hematochezia.  No dysuria, frequency or hematuria. She denied fever, chills, rhinorrhea, sore throat, wheezing or hemoptysis.  No chest pain, palpitations, diaphoresis, PND, orthopnea or pitting edema of the lower extremities.    No polyuria, polydipsia, polyphagia or blurred vision.   Lab work: Urinalysis was hazy in appearance, ketones of 5 mg/dL with small leukocyte esterase and rare bacteria microscopic examination.  There were 11-20 WBC on microscopic examination.  CBC showed a white count of 8.6, hemoglobin 12.7 g/dL and platelets 413.  Serum pregnancy test negative normal lipase.  CMP showed a glucose of 103 mg/dL, but was otherwise unremarkable.  Imaging: US  pelvic complete transvaginal/transabdominal ultrasound with Doppler showed complex solid and 60 right adnexal mass measuring  approximately 18.2 x 9.7 x 14.3 cm, mildly vascular, concerning for neoplasm.  There is a complex cyst within the left ovary measuring 2.6 x 2.0 x 2.2 cm.  Moderate free fluid.  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  10 mm indeterminate pulmonary nodule in the right middle lobe.  Dedicated nonemergent imaging of the chest is recommended for staging purposes and further evaluation.  ED course: Initial vital signs were temperature 98.6 F, pulse 120, respiration 18, BP 115/83 mmHg O2 sat 100% on room air.  The patient received morphine 4 mg IVP and 1000 mL of normal saline bolus.  Review of Systems: As mentioned in the history of present illness. All other systems reviewed and are negative. Past Medical History:  Diagnosis Date   Asthma 04/09/2019   no medications since 20s   Vitamin D  deficiency    Past Surgical History:  Procedure Laterality Date   NO PAST SURGERIES     Social History:  reports that she has never smoked. She has never used smokeless tobacco. She reports that she does not currently use alcohol. She reports that she does not use drugs.  Allergies  Allergen Reactions   Pneumovax [Pneumococcal Polysaccharide Vaccine] Other (See Comments)   Sulfamethoxazole-Trimethoprim Rash    Unsure of reaction, childhood allergy     Family History  Problem Relation Age of Onset   Arthritis Mother        Rhumatoid   Multiple myeloma Father 73   Breast cancer Sister    Heart attack Maternal Uncle 36  Stroke Maternal Uncle    Lupus Maternal Grandmother    Heart attack Maternal Grandfather    Heart attack Paternal Grandfather    Colon cancer Neg Hx    Ovarian cancer Neg Hx    Endometrial cancer  Neg Hx    Pancreatic cancer Neg Hx    Prostate cancer Neg Hx     Prior to Admission medications   Medication Sig Start Date End Date Taking? Authorizing Provider  oxyCODONE-acetaminophen (PERCOCET) 5-325 MG tablet Take 1 tablet by mouth every 6 (six) hours as needed for moderate pain (pain score 4-6) or severe pain (pain score 7-10). 01/26/24  Yes Nivia Colon, PA-C  Multiple Vitamin (MULTIVITAMIN) tablet Take 1 tablet by mouth daily.    [provider]  senna-docusate (SENOKOT-S) 8.6-50 MG tablet Take 1-2 tablets by mouth at bedtime. Do not take if having diarrhea. Can start with one tablet initially. 01/23/24   Cross, Melissa D, NP  traMADol (ULTRAM) 50 MG tablet Take 1 tablet (50 mg total) by mouth every 6 (six) hours as needed for moderate pain (pain score 4-6). For AFTER surgery only, do not take and drive 89/83/74   Micheline Eleanor BIRCH, NP    Physical Exam: Vitals:   01/26/24 1030 01/26/24 1100 01/26/24 1130 01/26/24 1145  BP: 128/85 126/77 125/78 121/79  Pulse: 96 94 99 (!) 106  Resp:    16  Temp:      TempSrc:      SpO2: 100% 100% 98% 98%   Physical Exam Vitals and nursing note reviewed.  Constitutional:      General: She is awake. She is not in acute distress.    Appearance: She is well-developed and normal weight. She is ill-appearing.  HENT:     Head: Normocephalic.     Nose: No rhinorrhea.     Mouth/Throat:     Mouth: Mucous membranes are dry.  Eyes:     General: No scleral icterus.    Pupils: Pupils are equal, round, and reactive to light.  Neck:     Vascular: No JVD.  Cardiovascular:     Rate and Rhythm: Normal rate and regular rhythm.     Heart sounds: S1 normal and S2 normal.  Pulmonary:     Effort: Pulmonary effort is normal.     Breath sounds: Normal breath sounds. No wheezing, rhonchi or rales.  Abdominal:     General: There is no distension.     Palpations: Abdomen is soft.     Tenderness: There is generalized abdominal tenderness and tenderness  in the right lower quadrant. There is right CVA tenderness and left CVA tenderness.  Musculoskeletal:     Cervical back: Neck supple.     Right lower leg: No edema.     Left lower leg: No edema.  Skin:    General: Skin is warm and dry.  Neurological:     General: No focal deficit present.     Mental Status: She is alert and oriented to person, place, and time.  Psychiatric:        Mood and Affect: Mood normal.        Behavior: Behavior normal. Behavior is cooperative.     Data Reviewed:  Results are pending, will review when available.  Assessment and Plan: Principal Problem:   Constipation  In the setting of:   Colonic mass Associated with:   Bilateral tubo-ovarian mass Observation/MedSurg. Continue IV fluids. Clear liquid diet. Keep n.p.o. after midnight. Analgesics as needed. Antiemetics as  needed. Bowel regimen per GI. Follow CBC, CMP in AM. Will consult GYN oncology in AM.  Active Problems:   Pulmonary nodule CT chest recommended by radiology. -Will order CT chest with contrast for further evaluation -She received IV contrast earlier today. -Will hydrate and schedule for the morning tomorrow.    Thrombocytosis In the setting of acute illness. Will recheck in AM.    Hyperglycemia Check fasting glucose in the morning. If abnormal, add on hemoglobin A1c.    Vitamin D  deficiency Vitamin D  supplementation.    Mixed hyperlipidemia Follow-up with primary care provider.    Asthma Asymptomatic at this time.    Advance Care Planning:   Code Status: Full Code   Consults: [facility] Vernice Commander, MD).   Family Communication:   Severity of Illness: The appropriate patient status for this patient is OBSERVATION. Observation status is judged to be reasonable and necessary in order to provide the required intensity of service to ensure the patient's safety. The patient's presenting symptoms, physical exam findings, and initial radiographic and  laboratory data in the context of their medical condition is felt to place them at decreased risk for further clinical deterioration. Furthermore, it is anticipated that the patient will be medically stable for discharge from the hospital within 2 midnights of admission.   Author: Alm Dorn Castor, MD 01/26/2024 2:17 PM  For on call review [email or web address].   This document was prepared using Dragon voice recognition software and may contain some unintended transcription errors.

## 2024-01-26 NOTE — ED Triage Notes (Signed)
 Pt presents via POV c/o intra-abd mass that shifted causing abd pain. Reports scheduled for a colonoscopy on Monday for diagnostic however woke up this am and felt like mass moved then immediately started having abd pain.

## 2024-01-26 NOTE — ED Provider Notes (Cosign Needed Addendum)
 [place] EMERGENCY DEPARTMENT AT [facility] Provider Note   CSN: [id number] Arrival date & time: 01/26/24  0541     Patient presents with: Abdominal Pain   Abigail Hicks is a 48 y.o. female.   The history is provided by the patient, medical records and the spouse. No language interpreter was used.  Abdominal Pain    48 year old female who presents to the ED with complaints of abdominal pain.  Patient developed abdominal pain constipation starting in June of this year.  She also endorsed having vaginal bleeding almost daily.  She felt that her abdomen was distended.  She was recently seen and evaluated by her doctor and was diagnosed with having a pelvic mass.  She does follow-up with an OB/GYN approximately 3 days ago for her symptoms and she is scheduled to have a diagnostic laparoscopy with possible open versus robotic assisted salpingo-oophorectomy, contralateral esophagectomy and possible contralateral oophorectomy and possible total hysterectomy.  She was told that she will be admitted to the hospital tomorrow for possible colonoscopy and further workup.  Unfortunately this morning she developed acute onset of pain to her abdomen and she felt like the mass has shifted downward.  Pain is intense with movement and seems to be improved when she lays still.  She does not endorse any fever or chills no nausea vomiting diarrhea no dysuria.  Prior to Admission medications   Medication Sig Start Date End Date Taking? Authorizing Provider  Multiple Vitamin (MULTIVITAMIN) tablet Take 1 tablet by mouth daily.    [provider]  senna-docusate (SENOKOT-S) 8.6-50 MG tablet Take 1-2 tablets by mouth at bedtime. Do not take if having diarrhea. Can start with one tablet initially. 01/23/24   Cross, Melissa D, NP  traMADol (ULTRAM) 50 MG tablet Take 1 tablet (50 mg total) by mouth every 6 (six) hours as needed for moderate pain (pain score 4-6). For AFTER surgery only, do not  take and drive 89/83/74   Cross, Melissa D, NP    Allergies: Pneumovax [pneumococcal polysaccharide vaccine] and Sulfamethoxazole-trimethoprim    Review of Systems  Gastrointestinal:  Positive for abdominal pain.  All other systems reviewed and are negative.   Updated Vital Signs BP 115/83   Pulse (!) 120   Temp 98.6 F (37 C) (Oral)   Resp 18   SpO2 100%   Physical Exam Vitals and nursing note reviewed.  Constitutional:      General: She is not in acute distress.    Appearance: She is well-developed.  HENT:     Head: Atraumatic.  Eyes:     Conjunctiva/sclera: Conjunctivae normal.  Cardiovascular:     Rate and Rhythm: Tachycardia present.  Pulmonary:     Effort: Pulmonary effort is normal.  Abdominal:     General: Bowel sounds are normal.     Tenderness: There is generalized abdominal tenderness. There is guarding. There is no rebound.  Musculoskeletal:     Cervical back: Neck supple.  Skin:    Findings: No rash.  Neurological:     Mental Status: She is alert.  Psychiatric:        Mood and Affect: Mood normal.     (all labs ordered are listed, but only abnormal results are displayed) Labs Reviewed  COMPREHENSIVE METABOLIC PANEL WITH GFR - Abnormal; Notable for the following components:      Result Value   Glucose, Bld 103 (*)    All other components within normal limits  CBC - Abnormal; Notable for  the following components:   Platelets 586 (*)    All other components within normal limits  URINALYSIS, ROUTINE W REFLEX MICROSCOPIC - Abnormal; Notable for the following components:   APPearance HAZY (*)    Ketones, ur 5 (*)    Leukocytes,Ua SMALL (*)    Bacteria, UA RARE (*)    All other components within normal limits  LIPASE, BLOOD  HCG, SERUM, QUALITATIVE    EKG: None  Radiology: CT ABDOMEN PELVIS W CONTRAST Result Date: 01/26/2024 EXAM: CT ABDOMEN AND PELVIS WITH CONTRAST 01/26/2024 08:38:00 AM TECHNIQUE: CT of the abdomen and pelvis was performed  with the administration of intravenous contrast, 100mL (iohexol (OMNIPAQUE) 300 MG/ML solution 100 mL IOHEXOL 300 MG/ML SOLN). Multiplanar reformatted images are provided for review. Automated exposure control, iterative reconstruction, and/or weight-based adjustment of the mA/kV was utilized to reduce the radiation dose to as low as reasonably achievable. COMPARISON: None available. CLINICAL HISTORY: Patient presents with a complaint of an intra-abdominal mass that shifted, causing abdominal pain. The patient is scheduled for a colonoscopy on Monday for diagnostic purposes. The patient reports that the mass moved, and immediately started having abdominal pain. *tracking code: Bo* FINDINGS: LOWER CHEST: A 10 mm indeterminate pulmonary nodule is seen along the paramediastinal right middle lobe at the visualized right lung base, along the pericardium, image 14/6. LIVER: Focal fatty hepatic infiltration is seen along the falciform ligament. There is mild hepatic steatosis and mild hepatomegaly. No enhancing intrahepatic mass or intrahepatic or extrahepatic biliary ductal dilation is present. GALLBLADDER AND BILE DUCTS: Macro and CT gallstones are present. The gallbladder is otherwise unremarkable. No biliary ductal dilatation is seen. SPLEEN: No acute abnormality is noted. PANCREAS: No acute abnormality is noted. ADRENAL GLANDS: No acute abnormality is noted. KIDNEYS, URETERS AND BLADDER: Scattered simple cysts are seen within the kidneys bilaterally, for which no follow up imaging is recommended. The kidneys are otherwise unremarkable, with no stones, hydronephrosis, perinephric or periureteral stranding. The urinary bladder is unremarkable. GI AND BOWEL: There is a large colonic stool burden with a focal point of transition involvement in the distal sigmoid colon, where there is slightly irregular mural thickening and luminal narrowing, best seen at image 72/5, sagittal image 102/10, and coronal image 74/8,  suspicious for an underlying obstructing colonic mass. Adjacent pathologic adenopathy is seen within the sigmoid colonic mesentery, measuring 1.6 x 2.7 cm at image 66/2, in keeping with regional nodal metastatic disease. Moderate ascites, peritoneal enhancement, and omental infiltration are seen, in keeping with signs of peritoneal carcinomatosis. REPRODUCTIVE ORGANS: Bilateral ovarian masses are seen, with the dominant septate, regularly enhancing mass involving the right ovary, measuring 10.5 x 14.6 x 12.5 cm. On the left, the mixed cystic and solid enhancing mass measures 5 x 3.4 cm. 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 BONES AND SOFT TISSUES: Bilateral L5 pars defects are seen. Osseous structures are age-appropriate, with no acute bone abnormality or lytic or blastic bone lesion. VASCULATURE: No mention of vascular abnormalities is made. LYMPH NODES: No lymphadenopathy is noted, except for the pathologic adenopathy mentioned in the GI and BOWEL section. PERITONEUM AND RETROPERITONEUM: Moderate ascites is present, with peritoneal enhancement and omental infiltration, in keeping with signs of peritoneal carcinomatosis. IMPRESSION: 1. 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 2. 10 mm indeterminate pulmonary nodule in the right middle lobe. Dedicated nonemergency imaging of the chest is recommended for staging purposes and further evaluation. . Electronically signed by: Dorethia Molt MD 01/26/2024 09:02 AM EDT RP Workstation: [id number]   US  PELVIC DOPPLER (TORSION R/O OR MASS ARTERIAL FLOW) Result Date: 01/26/2024 EXAM: US  Pelvis, Complete  Transvaginal and Transabdominal with Doppler 01/26/2024 07:53:00 AM TECHNIQUE: Transabdominal and transvaginal pelvic duplex ultrasound using B-mode/gray scaled imaging with Doppler spectral analysis and color flow was obtained. COMPARISON: None available CLINICAL HISTORY: Pelvic mass. FINDINGS: UTERUS: Uterus measures 9.1 x 4.4 x 5.6 cm, with an estimated volume of 116 ml. Uterus demonstrates normal myometrial echotexture. ENDOMETRIAL STRIPE: Endometrial measures 12 mm. Endometrial stripe is within normal limits. RIGHT OVARY: There is a complex solid and cystic right adnexal mass measuring approximately 18.2 x 9.7 x 14.3 cm. This mass is mildly vascular. LEFT OVARY: The left ovary measures 3.3 x 1.8 x 2.7 cm, with an estimated volume of 8.6 ml. There is a complex cyst within the left ovary measuring approximately 2.6 x 2.0 x 2.2 cm. There is normal arterial and venous Doppler flow to the left ovary. FREE FLUID: There is moderate free fluid. IMPRESSION: 1. Complex solid and cystic right adnexal mass measuring approximately 18.2 x 9.7 x 14.3 cm, mildly vascular, concerning for neoplasm. Gynecology consultation is recommended. 2. Complex cyst within the left ovary measuring approximately 2.6 x 2.0 x 2.2 cm. 3. Moderate free fluid. Electronically signed by: Evalene Coho MD 01/26/2024 08:18 AM EDT RP Workstation: [id number]   US  PELVIS (TRANSABDOMINAL ONLY) Result Date: 01/26/2024 EXAM: US  Pelvis, Complete Transvaginal and Transabdominal with Doppler 01/26/2024 07:53:00 AM TECHNIQUE: Transabdominal and transvaginal pelvic duplex ultrasound using B-mode/gray scaled imaging with Doppler spectral analysis and color flow was obtained. COMPARISON: None available CLINICAL HISTORY: Pelvic mass. FINDINGS: UTERUS: Uterus measures 9.1 x 4.4 x 5.6 cm, with an estimated volume of 116 ml. Uterus demonstrates normal myometrial echotexture. ENDOMETRIAL STRIPE: Endometrial measures 12 mm. Endometrial stripe is within normal  limits. RIGHT OVARY: There is a complex solid and cystic right adnexal mass measuring approximately 18.2 x 9.7 x 14.3 cm. This mass is mildly vascular. LEFT OVARY: The left ovary measures 3.3 x 1.8 x 2.7 cm, with an estimated volume of 8.6 ml. There is a complex cyst within the left ovary measuring approximately 2.6 x 2.0 x 2.2 cm. There is normal arterial and venous Doppler flow to the left ovary. FREE FLUID: There is moderate free fluid. IMPRESSION: 1. Complex solid and cystic right adnexal mass measuring approximately 18.2 x 9.7 x 14.3 cm, mildly vascular, concerning for neoplasm. Gynecology consultation is recommended. 2. Complex cyst within the left ovary measuring approximately 2.6 x 2.0 x 2.2 cm. 3. Moderate free fluid. Electronically signed by: Evalene Coho MD 01/26/2024 08:18 AM EDT RP Workstation: [id number]     Procedures   Medications Ordered in the ED  iohexol (OMNIPAQUE) 300 MG/ML solution 100 mL (100 mLs Intravenous Contrast Given 01/26/24 0827)  sodium chloride 0.9 % bolus 1,000 mL (0 mLs Intravenous Stopped 01/26/24 0919)  morphine (PF) 4 MG/ML injection 4 mg (4 mg Intravenous Given 01/26/24 0819)  morphine (PF) 4 MG/ML injection 4 mg (4 mg Intravenous Given 01/26/24 1149)                                    Medical Decision Making Amount and/or Complexity of  Data Reviewed Labs: ordered.  Risk Prescription drug management.   BP 115/83   Pulse (!) 120   Temp 98.6 F (37 C) (Oral)   Resp 18   SpO2 100%   78:13 AM 48 year old female who presents to the ED with complaints of abdominal pain.  Patient developed abdominal pain constipation starting in June of this year.  She also endorsed having vaginal bleeding almost daily.  She felt that her abdomen was distended.  She was recently seen and evaluated by her doctor and was diagnosed with having a pelvic mass.  She does follow-up with an OB/GYN approximately 3 days ago for her symptoms and she is scheduled to have a  diagnostic laparoscopy with possible open versus robotic assisted salpingo-oophorectomy, contralateral esophagectomy and possible contralateral oophorectomy and possible total hysterectomy.  She was told that she will be admitted to the hospital tomorrow for possible colonoscopy and further workup.  Unfortunately this morning she developed acute onset of pain to her abdomen and she felt like the mass has shifted downward.  Pain is intense with movement and seems to be improved when she lays still.  She does not endorse any fever or chills no nausea vomiting diarrhea no dysuria.  Exam notable for tachycardia with diffuse abdominal tenderness and some guarding present.  Bowel sounds are also present.    Work up initiated.  [employer] ordered, independently viewed and interpreted by me.  Labs remarkable for reassuring lab values -The patient was maintained on a cardiac monitor.  I personally viewed and interpreted the cardiac monitored which showed an underlying rhythm of: Normal sinus rhythm -Imaging independently viewed and interpreted by me and I agree with radiologist's interpretation.  Result remarkable for abdominal pelvis CT scan findings as is for underlying colonic mass involving the distal sigmoid colon concerning for either nodal metastasis disease, peritoneal carcinomatosis and bilateral ovarian mass suspicious for metastasis.  Transvaginal ultrasound was also performed which demonstrates cystic mass concerning for neoplasm but no evidence of torsion. -This patient presents to the ED for concern of abdominal pain, this involves an extensive number of treatment options, and is a complaint that carries with it a high risk of complications and morbidity.  The differential diagnosis includes malignancy, ovarian torsion, appendicitis, colitis, pancreatitis, cholecystitis -Co morbidities that complicate the patient evaluation includes asthma -Treatment includes IV fluid, morphine -Reevaluation of the  patient after these medicines showed that the patient improved -PCP office notes or outside notes reviewed -Discussion with specialist GI team who is aware of patient and will coordinate care inpatient.  I have also consulted with OBGYN Dr. Rogelio who felt pt can be discharge and return for admission tomorrow as previously scheduled.  -Escalation to admission/observation considered: patients feels much better, is comfortable with discharge, and will follow up tomorrow for hospital admission -Prescription medication considered, patient comfortable with percocet -Social Determinant of Health considered   Prior to being discharged, gastroenterologist Dr. Avram came to evaluate patient.  He felt that patient should be admitted since she has not had a bowel movement in about 3 days.  Patient has not had any nausea or vomiting but she is at high risk of worsening of symptoms.  Dr. Avram has ordered bowel prep.  I appreciate consultation from [facility] hospitalist Dr. Celinda who agrees to admit patient for further management of her pain as well as bowel preparation and anticipate the need to consult OB/GYN tomorrow for further management.    Final diagnoses:  Generalized abdominal pain  Pelvic mass  Colonic mass  Constipation due to outlet dysfunction    ED Discharge Orders          Ordered    oxyCODONE-acetaminophen (PERCOCET) 5-325 MG tablet  Every 6 hours PRN        01/26/24 1256               Nivia Colon, PA-C 01/26/24 1259    Nivia Colon, PA-C 01/26/24 1408    Rogelia Jerilynn RAMAN, MD 01/30/24 [phone]

## 2024-01-26 NOTE — Plan of Care (Signed)
   Problem: Education: Goal: Knowledge of General Education information will improve Description: Including pain rating scale, medication(s)/side effects and non-pharmacologic comfort measures Outcome: Progressing   Problem: Coping: Goal: Level of anxiety will decrease Outcome: Progressing

## 2024-01-26 NOTE — Consult Note (Addendum)
 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  This will be challenging and would start with smog enema and drinking some MiraLAX.  If we wait until tomorrow I do not think she could be adequately cleaned out for a colonoscopy on Tuesday and she may never be able to prep adequately given this obstruction.  Have discussed with the EDP PA, she will either be admitted to the medicine service with likely transfer to [facility] oncology subsequently versus admission to GYN oncology.  Patient agrees with this plan, she said she really did not have adequate pain relief.   Regarding the etiology of her problems, radiologist has suggested Krukenberg tumors as opposed to primary ovarian cancer based upon CT images.  Stomach looks okay on CT scan and there is no anemia.  Krukenberg tumors could result from a primary colonic tumor though classically they start with a gastric cancer which I think is unlikely but remains possible.  Pelvic ultrasound less convincing for bilateral ovarian tumors.   We will see the patient again tomorrow and determine next steps regarding colonoscopy versus sigmoidoscopy on Tuesday.  [name], MD, [facility] See TRACEY on call - gastroenterology for best contact person 01/26/2024 1:46 PM          HPI:    Abigail Hicks is a 48 y.o. female with bilateral ovarian masses awaiting surgical evaluation by Dr. Viktoria of gynecology oncology (plans for 01/29/2024) who presented to the emergency department earlier today with severe increasing abdominal pain.  She felt like something shifted and her pain intensified.  She has not defecated in 3 days despite trying a bowel regimen as recommended by Dr. Viktoria that included Senokot.  Prior to this she was having watery stools.  She has not vomited.  She has received morphine in the emergency department but she says it is ineffective.  She was to have a colonoscopy by my partner Dr. Wilhelmenia on October 21 after a scheduled admission on October 20 (tomorrow), because of an elevated CEA level at 15.  CA125 is 64.3 and quantitative beta-hCG less than 1 all on 01/23/2024..  Alpha-fetoprotein 5.4 since she was not vomiting gynecology on-call recommended that she could go home from the ED today.  I learned of that when I arrived in the emergency department to see the patient.  She describes a history of abdominal pain and constipation that began in June of this year. She did have a CT of the abdomen and pelvis through [facility] on 01/17/2024 with findings as below: IMPRESSION:  1. 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 Ascites, concerning for malignancy  3. Right hydroureteronephrosis to the level of the pelvic mass compatible with obstruction  4. Left ovary thought present in left anterior pelvis and also contains multiple cysts, largest 2.8 cm  5. Fecal retention  6. Cholelithiasis  7. Other findings as above  Wt Readings from Last 3 Encounters:  01/23/24 70.6 kg  04/09/19 71.7 kg  04/08/18 68 kg     Past Medical History:  Diagnosis Date   Asthma 04/09/2019   no medications since 20s   Vitamin D  deficiency     Past Surgical History:  Procedure Laterality Date   NO PAST SURGERIES      Family  History  Problem Relation Age of Onset   Arthritis Mother        Rhumatoid   Multiple myeloma Father 47   Breast cancer Sister    Heart attack Maternal Uncle 77   Stroke Maternal Uncle    Lupus Maternal Grandmother    Heart attack Maternal Grandfather    Heart attack Paternal Grandfather    Colon cancer Neg Hx    Ovarian cancer Neg Hx    Endometrial cancer Neg Hx    Pancreatic cancer Neg Hx    Prostate cancer Neg Hx      Social History   Tobacco Use   Smoking status: Never   Smokeless tobacco: Never  Vaping Use   Vaping status: Never Used  Substance Use Topics   Alcohol use: Not Currently   Drug use: No    Prior to Admission medications   Medication Sig Start Date End Date Taking? Authorizing Provider  oxyCODONE-acetaminophen (PERCOCET) 5-325 MG tablet Take 1 tablet by mouth every 6 (six) hours as needed for moderate pain (pain score 4-6) or severe pain (pain score 7-10). 01/26/24  Yes Nivia Colon, PA-C  Multiple Vitamin (MULTIVITAMIN) tablet Take 1 tablet by mouth daily.    [provider]  senna-docusate (SENOKOT-S) 8.6-50 MG tablet Take 1-2 tablets by mouth at bedtime. Do not take if having diarrhea. Can start with one tablet initially. 01/23/24   Cross, Melissa D, NP  traMADol (ULTRAM) 50 MG tablet Take 1 tablet (50 mg total) by mouth every 6 (six) hours as needed for moderate pain (pain score 4-6). For AFTER surgery only, do not take and drive 89/83/74   Cross, Melissa D, NP    No current facility-administered medications for this encounter.   Current Outpatient Medications  Medication Sig Dispense Refill   oxyCODONE-acetaminophen (PERCOCET) 5-325 MG tablet Take 1 tablet by mouth every 6 (six) hours as needed for moderate pain (pain score 4-6) or severe pain (pain score 7-10). 10 tablet 0   Multiple Vitamin (MULTIVITAMIN) tablet Take 1 tablet by mouth daily.     senna-docusate (SENOKOT-S) 8.6-50 MG tablet Take 1-2 tablets by mouth at bedtime. Do not take if  having diarrhea. Can start with one tablet initially. 60 tablet 3   traMADol (ULTRAM) 50 MG tablet Take 1 tablet (50 mg total) by mouth every 6 (six) hours as needed for moderate pain (pain score 4-6). For AFTER surgery only, do not take and drive 10 tablet 0    Allergies as of 01/26/2024 - Review Complete 01/26/2024  Allergen Reaction Noted   Pneumovax [pneumococcal polysaccharide vaccine] Other (See Comments) 02/23/2013   Sulfamethoxazole-trimethoprim Rash 09/05/2013     Review of Systems:    This is positive for those things mentioned in the HPI. All other review of systems are negative.       Physical Exam:  Vital signs in last 24 hours: Temp:  [98.4 F (36.9 C)-98.6 F (37 C)] 98.4 F (36.9 C) (10/19 0936) Pulse Rate:  [91-120] 106 (10/19 1145) Resp:  [16-18] 16 (10/19 1145) BP: (115-128)/(77-85) 121/79 (10/19 1145) SpO2:  [98 %-100 %] 98 % (  10/19 1145)    General:  Well-developed, well-nourished and in no acute distress Eyes:  anicteric. Lungs: Clear to auscultation bilaterally. Heart:   S1S2, no rubs, murmurs, gallops. Abdomen: Moderately distended, bowel sounds diminished, tympanitic, very tender bilateral lower quadrants to  palpation but no peritoneal findings.  There is a fullness in the mid to lower abdomen diffusely. Extremities:   no edema Skin   no rash. Neuro:  A&O x 3.  Psych:  appropriate mood and  Affect.   Data Reviewed:   LAB RESULTS: Recent Labs    01/26/24 0557  WBC 8.6  HGB 12.7  HCT 40.7  PLT 586*   BMET Recent Labs    01/26/24 0557  NA 141  K 3.7  CL 106  CO2 23  GLUCOSE 103*  BUN 10  CREATININE 0.72  CALCIUM 9.2   LFT Recent Labs    01/26/24 0557  PROT 7.0  ALBUMIN 4.0  AST 24  ALT <5  ALKPHOS 53  BILITOT 0.4     STUDIES: CT ABDOMEN PELVIS W CONTRAST Result Date: 01/26/2024 EXAM: CT ABDOMEN AND PELVIS WITH CONTRAST 01/26/2024 08:38:00 AM TECHNIQUE: CT of the abdomen and pelvis was performed with the  administration of intravenous contrast, 100mL (iohexol (OMNIPAQUE) 300 MG/ML solution 100 mL IOHEXOL 300 MG/ML SOLN). Multiplanar reformatted images are provided for review. Automated exposure control, iterative reconstruction, and/or weight-based adjustment of the mA/kV was utilized to reduce the radiation dose to as low as reasonably achievable. COMPARISON: None available. CLINICAL HISTORY: Patient presents with a complaint of an intra-abdominal mass that shifted, causing abdominal pain. The patient is scheduled for a colonoscopy on Monday for diagnostic purposes. The patient reports that the mass moved, and immediately started having abdominal pain. *tracking code: Bo* FINDINGS: LOWER CHEST: A 10 mm indeterminate pulmonary nodule is seen along the paramediastinal right middle lobe at the visualized right lung base, along the pericardium, image 14/6. LIVER: Focal fatty hepatic infiltration is seen along the falciform ligament. There is mild hepatic steatosis and mild hepatomegaly. No enhancing intrahepatic mass or intrahepatic or extrahepatic biliary ductal dilation is present. GALLBLADDER AND BILE DUCTS: Macro and CT gallstones are present. The gallbladder is otherwise unremarkable. No biliary ductal dilatation is seen. SPLEEN: No acute abnormality is noted. PANCREAS: No acute abnormality is noted. ADRENAL GLANDS: No acute abnormality is noted. KIDNEYS, URETERS AND BLADDER: Scattered simple cysts are seen within the kidneys bilaterally, for which no follow up imaging is recommended. The kidneys are otherwise unremarkable, with no stones, hydronephrosis, perinephric or periureteral stranding. The urinary bladder is unremarkable. GI AND BOWEL: There is a large colonic stool burden with a focal point of transition involvement in the distal sigmoid colon, where there is slightly irregular mural thickening and luminal narrowing, best seen at image 72/5, sagittal image 102/10, and coronal image 74/8, suspicious for an  underlying obstructing colonic mass. Adjacent pathologic adenopathy is seen within the sigmoid colonic mesentery, measuring 1.6 x 2.7 cm at image 66/2, in keeping with regional nodal metastatic disease. Moderate ascites, peritoneal enhancement, and omental infiltration are seen, in keeping with signs of peritoneal carcinomatosis. REPRODUCTIVE ORGANS: Bilateral ovarian masses are seen, with the dominant septate, regularly enhancing mass involving the right ovary, measuring 10.5 x 14.6 x 12.5 cm. On the left, the mixed cystic and solid enhancing mass measures 5 x 3.4 cm. 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 BONES AND SOFT TISSUES: Bilateral L5 pars defects are seen. Osseous  structures are age-appropriate, with no acute bone abnormality or lytic or blastic bone lesion. VASCULATURE: No mention of vascular abnormalities is made. LYMPH NODES: No lymphadenopathy is noted, except for the pathologic adenopathy mentioned in the GI and BOWEL section. PERITONEUM AND RETROPERITONEUM: Moderate ascites is present, with peritoneal enhancement and omental infiltration, in keeping with signs of peritoneal carcinomatosis. IMPRESSION: 1. 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 2. 10 mm indeterminate pulmonary nodule in the right middle lobe. Dedicated nonemergency imaging of the chest is recommended for staging purposes and further evaluation. . Electronically signed by: Dorethia Molt MD 01/26/2024 09:02 AM EDT RP Workstation: [id number]   US  PELVIC DOPPLER (TORSION R/O OR MASS ARTERIAL FLOW) Result Date: 01/26/2024 EXAM: US  Pelvis, Complete Transvaginal and  Transabdominal with Doppler 01/26/2024 07:53:00 AM TECHNIQUE: Transabdominal and transvaginal pelvic duplex ultrasound using B-mode/gray scaled imaging with Doppler spectral analysis and color flow was obtained. COMPARISON: None available CLINICAL HISTORY: Pelvic mass. FINDINGS: UTERUS: Uterus measures 9.1 x 4.4 x 5.6 cm, with an estimated volume of 116 ml. Uterus demonstrates normal myometrial echotexture. ENDOMETRIAL STRIPE: Endometrial measures 12 mm. Endometrial stripe is within normal limits. RIGHT OVARY: There is a complex solid and cystic right adnexal mass measuring approximately 18.2 x 9.7 x 14.3 cm. This mass is mildly vascular. LEFT OVARY: The left ovary measures 3.3 x 1.8 x 2.7 cm, with an estimated volume of 8.6 ml. There is a complex cyst within the left ovary measuring approximately 2.6 x 2.0 x 2.2 cm. There is normal arterial and venous Doppler flow to the left ovary. FREE FLUID: There is moderate free fluid. IMPRESSION: 1. Complex solid and cystic right adnexal mass measuring approximately 18.2 x 9.7 x 14.3 cm, mildly vascular, concerning for neoplasm. Gynecology consultation is recommended. 2. Complex cyst within the left ovary measuring approximately 2.6 x 2.0 x 2.2 cm. 3. Moderate free fluid. Electronically signed by: Evalene Coho MD 01/26/2024 08:18 AM EDT RP Workstation: [id number]   US  PELVIS (TRANSABDOMINAL ONLY) Result Date: 01/26/2024 EXAM: US  Pelvis, Complete Transvaginal and Transabdominal with Doppler 01/26/2024 07:53:00 AM TECHNIQUE: Transabdominal and transvaginal pelvic duplex ultrasound using B-mode/gray scaled imaging with Doppler spectral analysis and color flow was obtained. COMPARISON: None available CLINICAL HISTORY: Pelvic mass. FINDINGS: UTERUS: Uterus measures 9.1 x 4.4 x 5.6 cm, with an estimated volume of 116 ml. Uterus demonstrates normal myometrial echotexture. ENDOMETRIAL STRIPE: Endometrial measures 12 mm. Endometrial stripe is within normal limits. RIGHT OVARY:  There is a complex solid and cystic right adnexal mass measuring approximately 18.2 x 9.7 x 14.3 cm. This mass is mildly vascular. LEFT OVARY: The left ovary measures 3.3 x 1.8 x 2.7 cm, with an estimated volume of 8.6 ml. There is a complex cyst within the left ovary measuring approximately 2.6 x 2.0 x 2.2 cm. There is normal arterial and venous Doppler flow to the left ovary. FREE FLUID: There is moderate free fluid. IMPRESSION: 1. Complex solid and cystic right adnexal mass measuring approximately 18.2 x 9.7 x 14.3 cm, mildly vascular, concerning for neoplasm. Gynecology consultation is recommended. 2. Complex cyst within the left ovary measuring approximately 2.6 x 2.0 x 2.2 cm. 3. Moderate free fluid. Electronically signed by: Evalene Coho MD 01/26/2024 08:18 AM EDT RP Workstation: [id number]       Thanks   LOS: 0  days   @Anetha Slagel  CHARLENA Commander, MD, [facility] @  01/26/2024, 1:46 PM

## 2024-01-26 NOTE — ED Notes (Signed)
 Pt transported to US 

## 2024-01-26 NOTE — Discharge Instructions (Signed)
 Your abdominal pain is likely due to colonic mass and pelvic mass.  Please await for an available bed tomorrow to be admitted for further evaluation and management.  In the meantime you may take pain medication as prescribed.  Do not hesitate to return if your condition worsen.  I wish you the best.

## 2024-01-27 ENCOUNTER — Observation Stay (HOSPITAL_COMMUNITY)

## 2024-01-27 ENCOUNTER — Telehealth: Payer: Self-pay

## 2024-01-27 DIAGNOSIS — R590 Localized enlarged lymph nodes: Secondary | ICD-10-CM | POA: Diagnosis not present

## 2024-01-27 DIAGNOSIS — K5669 Other partial intestinal obstruction: Secondary | ICD-10-CM | POA: Diagnosis present

## 2024-01-27 DIAGNOSIS — R933 Abnormal findings on diagnostic imaging of other parts of digestive tract: Secondary | ICD-10-CM | POA: Diagnosis not present

## 2024-01-27 DIAGNOSIS — R1084 Generalized abdominal pain: Secondary | ICD-10-CM | POA: Diagnosis not present

## 2024-01-27 DIAGNOSIS — K8020 Calculus of gallbladder without cholecystitis without obstruction: Secondary | ICD-10-CM | POA: Diagnosis present

## 2024-01-27 DIAGNOSIS — Z800 Family history of malignant neoplasm of digestive organs: Secondary | ICD-10-CM | POA: Diagnosis not present

## 2024-01-27 DIAGNOSIS — K641 Second degree hemorrhoids: Secondary | ICD-10-CM | POA: Diagnosis present

## 2024-01-27 DIAGNOSIS — K6389 Other specified diseases of intestine: Secondary | ICD-10-CM | POA: Diagnosis not present

## 2024-01-27 DIAGNOSIS — E782 Mixed hyperlipidemia: Secondary | ICD-10-CM | POA: Diagnosis present

## 2024-01-27 DIAGNOSIS — C7963 Secondary malignant neoplasm of bilateral ovaries: Secondary | ICD-10-CM | POA: Diagnosis present

## 2024-01-27 DIAGNOSIS — C7800 Secondary malignant neoplasm of unspecified lung: Secondary | ICD-10-CM | POA: Diagnosis present

## 2024-01-27 DIAGNOSIS — Z882 Allergy status to sulfonamides status: Secondary | ICD-10-CM | POA: Diagnosis not present

## 2024-01-27 DIAGNOSIS — K5900 Constipation, unspecified: Secondary | ICD-10-CM | POA: Diagnosis not present

## 2024-01-27 DIAGNOSIS — D509 Iron deficiency anemia, unspecified: Secondary | ICD-10-CM | POA: Diagnosis present

## 2024-01-27 DIAGNOSIS — N1330 Unspecified hydronephrosis: Secondary | ICD-10-CM | POA: Diagnosis present

## 2024-01-27 DIAGNOSIS — J45909 Unspecified asthma, uncomplicated: Secondary | ICD-10-CM | POA: Diagnosis present

## 2024-01-27 DIAGNOSIS — Z79899 Other long term (current) drug therapy: Secondary | ICD-10-CM | POA: Diagnosis not present

## 2024-01-27 DIAGNOSIS — C187 Malignant neoplasm of sigmoid colon: Secondary | ICD-10-CM | POA: Diagnosis present

## 2024-01-27 DIAGNOSIS — K5902 Outlet dysfunction constipation: Secondary | ICD-10-CM | POA: Diagnosis present

## 2024-01-27 DIAGNOSIS — C19 Malignant neoplasm of rectosigmoid junction: Secondary | ICD-10-CM | POA: Diagnosis not present

## 2024-01-27 DIAGNOSIS — Z881 Allergy status to other antibiotic agents status: Secondary | ICD-10-CM | POA: Diagnosis not present

## 2024-01-27 DIAGNOSIS — K56699 Other intestinal obstruction unspecified as to partial versus complete obstruction: Secondary | ICD-10-CM

## 2024-01-27 DIAGNOSIS — Z887 Allergy status to serum and vaccine status: Secondary | ICD-10-CM | POA: Diagnosis not present

## 2024-01-27 DIAGNOSIS — Z8249 Family history of ischemic heart disease and other diseases of the circulatory system: Secondary | ICD-10-CM | POA: Diagnosis not present

## 2024-01-27 DIAGNOSIS — R188 Other ascites: Secondary | ICD-10-CM | POA: Diagnosis present

## 2024-01-27 DIAGNOSIS — Q438 Other specified congenital malformations of intestine: Secondary | ICD-10-CM | POA: Diagnosis not present

## 2024-01-27 DIAGNOSIS — E559 Vitamin D deficiency, unspecified: Secondary | ICD-10-CM | POA: Diagnosis present

## 2024-01-27 DIAGNOSIS — N9489 Other specified conditions associated with female genital organs and menstrual cycle: Secondary | ICD-10-CM | POA: Diagnosis not present

## 2024-01-27 DIAGNOSIS — R1900 Intra-abdominal and pelvic swelling, mass and lump, unspecified site: Secondary | ICD-10-CM | POA: Diagnosis present

## 2024-01-27 DIAGNOSIS — D75838 Other thrombocytosis: Secondary | ICD-10-CM | POA: Diagnosis not present

## 2024-01-27 DIAGNOSIS — R739 Hyperglycemia, unspecified: Secondary | ICD-10-CM | POA: Diagnosis present

## 2024-01-27 LAB — CBC
HCT: 34 % — ABNORMAL LOW (ref 36.0–46.0)
Hemoglobin: 10.2 g/dL — ABNORMAL LOW (ref 12.0–15.0)
MCH: 25.4 pg — ABNORMAL LOW (ref 26.0–34.0)
MCHC: 30 g/dL (ref 30.0–36.0)
MCV: 84.8 fL (ref 80.0–100.0)
Platelets: 468 K/uL — ABNORMAL HIGH (ref 150–400)
RBC: 4.01 MIL/uL (ref 3.87–5.11)
RDW: 12.4 % (ref 11.5–15.5)
WBC: 13.7 K/uL — ABNORMAL HIGH (ref 4.0–10.5)
nRBC: 0 % (ref 0.0–0.2)

## 2024-01-27 LAB — COMPREHENSIVE METABOLIC PANEL WITH GFR
ALT: <5 U/L (ref 0–44)
AST: 35 U/L (ref 15–41)
Albumin: 3.4 g/dL — ABNORMAL LOW (ref 3.5–5.0)
Alkaline Phosphatase: 44 U/L (ref 38–126)
Anion gap: 10 (ref 5–15)
BUN: 12 mg/dL (ref 6–20)
CO2: 20 mmol/L — ABNORMAL LOW (ref 22–32)
Calcium: 8.4 mg/dL — ABNORMAL LOW (ref 8.9–10.3)
Chloride: 106 mmol/L (ref 98–111)
Creatinine, Ser: 0.6 mg/dL (ref 0.44–1.00)
GFR, Estimated: >60 mL/min (ref >60)
Glucose, Bld: 112 mg/dL — ABNORMAL HIGH (ref 70–99)
Potassium: 3.9 mmol/L (ref 3.5–5.1)
Sodium: 136 mmol/L (ref 135–145)
Total Bilirubin: 0.5 mg/dL (ref 0.0–1.2)
Total Protein: 5.8 g/dL — ABNORMAL LOW (ref 6.5–8.1)

## 2024-01-27 LAB — HIV ANTIBODY (ROUTINE TESTING W REFLEX): HIV Screen 4th Generation wRfx: NONREACTIVE

## 2024-01-27 MED ORDER — HYDROMORPHONE HCL 1 MG/ML IJ SOLN
1.0000 mg | INTRAMUSCULAR | Status: DC | PRN
  Administered 2024-01-27 – 2024-01-28 (×2): 1 mg via INTRAVENOUS
  Filled 2024-01-27 (×2): qty 1

## 2024-01-27 MED ORDER — SODIUM CHLORIDE 0.9 % IV SOLN: INTRAVENOUS | Status: AC

## 2024-01-27 MED ORDER — NA SULFATE-K SULFATE-MG SULF 17.5-3.13-1.6 GM/177ML PO SOLN
0.5000 | Freq: Once | ORAL | Status: AC
  Administered 2024-01-28: 177 mL via ORAL
  Filled 2024-01-27: qty 1

## 2024-01-27 MED ORDER — IOHEXOL 300 MG/ML  SOLN
75.0000 mL | Freq: Once | INTRAMUSCULAR | Status: AC | PRN
  Administered 2024-01-27: 75 mL via INTRAVENOUS

## 2024-01-27 MED ORDER — NA SULFATE-K SULFATE-MG SULF 17.5-3.13-1.6 GM/177ML PO SOLN
0.5000 | Freq: Once | ORAL | Status: AC
  Administered 2024-01-27: 177 mL via ORAL
  Filled 2024-01-27: qty 1

## 2024-01-27 NOTE — H&P (View-Only) (Signed)
 [facility] Progress Note  CC:  Colonic mass and stricture   Subjective:  Feels ok.  Drinking and tolerating the miralax without issues.  Some nausea that is relieved by anti-emetics, no vomiting.  Having bowel movements.  K-pad is helpful with diffuse abdominal pain.  Objective:  Vital signs in last 24 hours: Temp:  [98.2 F (36.8 C)-98.6 F (37 C)] 98.2 F (36.8 C) (10/20 0604) Pulse Rate:  [88-106] 93 (10/20 0911) Resp:  [16-18] 18 (10/20 0604) BP: (111-128)/(66-85) 111/66 (10/20 0911) SpO2:  [96 %-100 %] 100 % (10/20 0911) Last BM Date : 01/26/24 General:  Alert, Well-developed, in NAD Heart:  Regular rate and rhythm; no murmurs Pulm:  CTAB.  No W/R/R. Abdomen:  Soft, slightly distended.  BS present.  Moderate diffuse TTP. Extremities:  Without edema. Neurologic:  Alert and oriented x 4;  grossly normal neurologically. Psych:  Alert and cooperative. Normal mood and affect.  Intake/Output from previous day: 10/19 0701 - 10/20 0700 In: 1242.1 [P.O.:240; I.V.:2.1; IV Piggyback:1000] Out: -   Lab Results: Recent Labs    01/26/24 0557 01/27/24 0412  WBC 8.6 13.7*  HGB 12.7 10.2*  HCT 40.7 34.0*  PLT 586* 468*   BMET Recent Labs    01/26/24 0557 01/27/24 0412  NA 141 136  K 3.7 3.9  CL 106 106  CO2 23 20*  GLUCOSE 103* 112*  BUN 10 12  CREATININE 0.72 0.60  CALCIUM 9.2 8.4*   LFT Recent Labs    01/27/24 0412  PROT 5.8*  ALBUMIN 3.4*  AST 35  ALT <5  ALKPHOS 44  BILITOT 0.5   CT ABDOMEN PELVIS W CONTRAST Result Date: 01/26/2024 EXAM: CT ABDOMEN AND PELVIS WITH CONTRAST 01/26/2024 08:38:00 AM TECHNIQUE: CT of the abdomen and pelvis was performed with the administration of intravenous contrast, 100mL (iohexol (OMNIPAQUE) 300 MG/ML solution 100 mL IOHEXOL 300 MG/ML SOLN). Multiplanar reformatted images are provided for review. Automated exposure control, iterative reconstruction, and/or weight-based adjustment of the mA/kV was utilized  to reduce the radiation dose to as low as reasonably achievable. COMPARISON: None available. CLINICAL HISTORY: Patient presents with a complaint of an intra-abdominal mass that shifted, causing abdominal pain. The patient is scheduled for a colonoscopy on Monday for diagnostic purposes. The patient reports that the mass moved, and immediately started having abdominal pain. *tracking code: Bo* FINDINGS: LOWER CHEST: A 10 mm indeterminate pulmonary nodule is seen along the paramediastinal right middle lobe at the visualized right lung base, along the pericardium, image 14/6. LIVER: Focal fatty hepatic infiltration is seen along the falciform ligament. There is mild hepatic steatosis and mild hepatomegaly. No enhancing intrahepatic mass or intrahepatic or extrahepatic biliary ductal dilation is present. GALLBLADDER AND BILE DUCTS: Macro and CT gallstones are present. The gallbladder is otherwise unremarkable. No biliary ductal dilatation is seen. SPLEEN: No acute abnormality is noted. PANCREAS: No acute abnormality is noted. ADRENAL GLANDS: No acute abnormality is noted. KIDNEYS, URETERS AND BLADDER: Scattered simple cysts are seen within the kidneys bilaterally, for which no follow up imaging is recommended. The kidneys are otherwise unremarkable, with no stones, hydronephrosis, perinephric or periureteral stranding. The urinary bladder is unremarkable. GI AND BOWEL: There is a large colonic stool burden with a focal point of transition involvement in the distal sigmoid colon, where there is slightly irregular mural thickening and luminal narrowing, best seen at image 72/5, sagittal image 102/10, and coronal image 74/8, suspicious for an underlying obstructing colonic mass. Adjacent pathologic adenopathy  is seen within the sigmoid colonic mesentery, measuring 1.6 x 2.7 cm at image 66/2, in keeping with regional nodal metastatic disease. Moderate ascites, peritoneal enhancement, and omental infiltration are seen, in  keeping with signs of peritoneal carcinomatosis. REPRODUCTIVE ORGANS: Bilateral ovarian masses are seen, with the dominant septate, regularly enhancing mass involving the right ovary, measuring 10.5 x 14.6 x 12.5 cm. On the left, the mixed cystic and solid enhancing mass measures 5 x 3.4 cm. 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 BONES AND SOFT TISSUES: Bilateral L5 pars defects are seen. Osseous structures are age-appropriate, with no acute bone abnormality or lytic or blastic bone lesion. VASCULATURE: No mention of vascular abnormalities is made. LYMPH NODES: No lymphadenopathy is noted, except for the pathologic adenopathy mentioned in the GI and BOWEL section. PERITONEUM AND RETROPERITONEUM: Moderate ascites is present, with peritoneal enhancement and omental infiltration, in keeping with signs of peritoneal carcinomatosis. IMPRESSION: 1. 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 2. 10 mm indeterminate pulmonary nodule in the right middle lobe. Dedicated nonemergency imaging of the chest is recommended for staging purposes and further evaluation. . Electronically signed by: Dorethia Molt MD 01/26/2024 09:02 AM EDT RP Workstation: [id number]   US  PELVIC DOPPLER (TORSION R/O OR MASS ARTERIAL FLOW) Result Date: 01/26/2024 EXAM: US  Pelvis, Complete Transvaginal and Transabdominal with Doppler 01/26/2024 07:53:00 AM TECHNIQUE: Transabdominal and transvaginal pelvic duplex ultrasound using B-mode/gray scaled imaging with Doppler spectral analysis and color flow was obtained. COMPARISON: None available CLINICAL HISTORY: Pelvic mass. FINDINGS: UTERUS:  Uterus measures 9.1 x 4.4 x 5.6 cm, with an estimated volume of 116 ml. Uterus demonstrates normal myometrial echotexture. ENDOMETRIAL STRIPE: Endometrial measures 12 mm. Endometrial stripe is within normal limits. RIGHT OVARY: There is a complex solid and cystic right adnexal mass measuring approximately 18.2 x 9.7 x 14.3 cm. This mass is mildly vascular. LEFT OVARY: The left ovary measures 3.3 x 1.8 x 2.7 cm, with an estimated volume of 8.6 ml. There is a complex cyst within the left ovary measuring approximately 2.6 x 2.0 x 2.2 cm. There is normal arterial and venous Doppler flow to the left ovary. FREE FLUID: There is moderate free fluid. IMPRESSION: 1. Complex solid and cystic right adnexal mass measuring approximately 18.2 x 9.7 x 14.3 cm, mildly vascular, concerning for neoplasm. Gynecology consultation is recommended. 2. Complex cyst within the left ovary measuring approximately 2.6 x 2.0 x 2.2 cm. 3. Moderate free fluid. Electronically signed by: Evalene Coho MD 01/26/2024 08:18 AM EDT RP Workstation: [id number]   US  PELVIS (TRANSABDOMINAL ONLY) Result Date: 01/26/2024 EXAM: US  Pelvis, Complete Transvaginal and Transabdominal with Doppler 01/26/2024 07:53:00 AM TECHNIQUE: Transabdominal and transvaginal pelvic duplex ultrasound using B-mode/gray scaled imaging with Doppler spectral analysis and color flow was obtained. COMPARISON: None available CLINICAL HISTORY: Pelvic mass. FINDINGS: UTERUS: Uterus measures 9.1 x 4.4 x 5.6 cm, with an estimated volume of 116 ml. Uterus demonstrates normal myometrial echotexture. ENDOMETRIAL STRIPE: Endometrial measures 12 mm. Endometrial stripe is within normal limits. RIGHT OVARY: There is a complex solid and cystic right adnexal mass measuring approximately 18.2 x 9.7 x 14.3 cm. This mass is mildly vascular. LEFT OVARY: The left ovary measures 3.3 x 1.8 x 2.7 cm, with an estimated volume of 8.6 ml. There  is a complex cyst within the left ovary measuring  approximately 2.6 x 2.0 x 2.2 cm. There is normal arterial and venous Doppler flow to the left ovary. FREE FLUID: There is moderate free fluid. IMPRESSION: 1. Complex solid and cystic right adnexal mass measuring approximately 18.2 x 9.7 x 14.3 cm, mildly vascular, concerning for neoplasm. Gynecology consultation is recommended. 2. Complex cyst within the left ovary measuring approximately 2.6 x 2.0 x 2.2 cm. 3. Moderate free fluid. 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  Leukocytosis with WBC count of 13.7K.  -Colonoscopy vs sigmoidoscopy 10/21.  She has tolerated the miralax so far and is having bowel movements so hopeful that we can prep her well.    LOS: 0 days   Abigail Hicks. Abigail Hicks  01/27/2024, 9:14 AM    Attending Physician's Attestation   I have taken an interval history, reviewed the chart and examined the patient.   Extensive discussion and review of imaging last week and again this week.  Discussed with patient and husband as well as oncology this afternoon upon evaluation.  Plan for increased risk colonoscopy attempt tomorrow to evaluate the colonic mass/pelvic mass.  The risks of an EUS including intestinal perforation, bleeding, infection, aspiration, and medication effects were discussed as was the possibility it may not give a definitive diagnosis if a biopsy is performed.  When a biopsy of the pancreas is done as part of the EUS, there is an additional risk of pancreatitis at the rate of about 1-2%.  It was explained that procedure related pancreatitis is typically mild, although it can be severe and even life threatening, which is why we do not perform random pancreatic biopsies  and only biopsy a lesion/area we feel is concerning enough to warrant the risk.  Possibility of enteral stenting can be made, though if this is an extrinsic lesion, migration of stents is more likely to occur rather than if this is an intrinsic.  Will see what we may be able to pursue and if we are unable to perform full colonoscopy, then sigmoidoscopy at minimum will be completed.  She is tolerating her bowel preparation currently.  Abdominal exam is relatively benign though palpation of the mass is noted.  All patient questions were answered to the best of my ability, and the patient agrees to the aforementioned plan of action with follow-up as indicated.   I agree with the Advanced Practitioner's note, impression, and recommendations with updates and my documentation as noted above.  The majority of the medical decision making/process, formulation of the impression/plan of action for the patient were performed by me with substantive portion of this encounter (>50% time spent including complete performance of at least one of the key components of MDM, History, and/or Exam).   Abigail Finner, MD [facility] Office # [phone]

## 2024-01-27 NOTE — Plan of Care (Signed)
   Problem: Education: Goal: Knowledge of General Education information will improve Description: Including pain rating scale, medication(s)/side effects and non-pharmacologic comfort measures Outcome: Progressing   Problem: Activity: Goal: Risk for activity intolerance will decrease Outcome: Progressing

## 2024-01-27 NOTE — Progress Notes (Signed)
 PROGRESS NOTE    Abigail Hicks  FMW:[id number] DOB: 1975-12-25 DOA: 01/26/2024 PCP: [name]   Brief Narrative:   48 y.o. female with medical history significant of acute urinary retention episode, asthma, vitamin D  deficiency who presented to the emergency department complaints of abdominal pain constipation.  She was scheduled to be admitted on 10/20 by GYN oncology (Dr. Comer Dollar) for bilateral ovarian masses further evaluation and laparoscopic procedure versus robotic assisted salpingo-oophorectomy, contralateral oophorectomy and possible total hysterectomy, but presented this morning to the emergency department with 3-day history of constipation and sudden onset of abdominal pain.  US  pelvic complete transvaginal/transabdominal ultrasound with Doppler showed complex solid and 60 right adnexal mass measuring approximately 18.2 x 9.7 x 14.3 cm, mildly vascular, concerning for neoplasm.  There is a complex cyst within the left ovary measuring 2.6 x 2.0 x 2.2 cm.  Moderate free fluid.  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  10 mm indeterminate pulmonary nodule in the right middle lobe  GI consulted and patient needs colonoscopy vs sigmoidoscopy.  Assessment & Plan:  Principal Problem:   Colonic mass Active Problems:   Vitamin D  deficiency   Mixed hyperlipidemia   Asthma   Thrombocytosis   Hyperglycemia   Bilateral tubo-ovarian mass   Constipation   B/l ovarian masses:  -US  pelvic complete transvaginal/transabdominal ultrasound with Doppler showed complex solid and 60 right adnexal mass measuring approximately 18.2 x 9.7 x 14.3 cm, mildly vascular, concerning for neoplasm.   There is a complex cyst within the left ovary measuring 2.6 x 2.0 x 2.2 cm.  Moderate free fluid.  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 -GI On baord. -Initial plan was to do laparoscopic procedure versus robotic assisted salpingo-oophorectomy, contralateral oophorectomy and possible total hysterectomy, -f/u gyn onc recommendations.   Pulmonary nodule,POA: CT chest with contrast done this morning  Thrombocytosis: f/u closely. May be reactive in the setting of underlying malignancy  Vitamin D  deficiency: continue with Vitamin D  supplementation  Asthma: No acute issues  Disposition: Home, lives with her husband and has two children.   DVT prophylaxis: SCDs Start: 01/26/24 1652     Code Status: Full Code Family Communication:  None at the bedside Status is: Observation The patient remains OBS appropriate and will d/c before 2 midnights.    Subjective:  No acute events overnight. CT chest was done this morning. She has been having lower abdominal discomfort. She went to her PCP who ordered a CT abdomen which showed findings concerning for ovarian tumor. She follows up with Dr Dollar for that.   Examination:  General exam: Appears calm and comfortable  Respiratory system: Clear to auscultation. Respiratory effort normal. Cardiovascular system: S1 & S2 heard, RRR. No JVD, murmurs, rubs, gallops or clicks. No pedal edema. Gastrointestinal system: Abdomen is nondistended, soft and nontender. No organomegaly or masses felt. Normal bowel sounds  heard. Central nervous system: Alert and oriented. No focal neurological deficits. Extremities: Symmetric 5 x 5 power.  Skin: No rashes, lesions or ulcers Psychiatry: Judgement and insight appear normal. Mood & affect appropriate.     Diet Orders (From admission, onward)     Start     Ordered   01/26/24 1350  Diet clear liquid Fluid consistency: Thin  Diet effective now       Question:  Fluid consistency:  Answer:  Thin   01/26/24 1350            Objective: Vitals:   01/26/24 1528 01/26/24 1934 01/27/24 0604 01/27/24 0911  BP: 123/76 128/72 128/76 111/66  Pulse: (!) 102 99 88 93  Resp: 18 18 18    Temp: 98.6 F (37 C) 98.5 F (36.9 C) 98.2 F (36.8 C)   TempSrc: Oral  Oral   SpO2: 98% 98% 99% 100%    Intake/Output Summary (Last 24 hours) at 01/27/2024 0915 Last data filed at 01/26/2024 2100 Gross per 24 hour  Intake 1242.08 ml  Output --  Net 1242.08 ml   There were no vitals filed for this visit.  Scheduled Meds:  polyethylene glycol  17 g Oral QID   Continuous Infusions:  sodium chloride 125 mL/hr at 01/27/24 0908    Nutritional status     There is no height or weight on file to calculate BMI.  Data Reviewed:   CBC: Recent Labs  Lab 01/26/24 0557 01/27/24 0412  WBC 8.6 13.7*  HGB 12.7 10.2*  HCT 40.7 34.0*  MCV 83.2 84.8  PLT 586* 468*   Basic Metabolic Panel: Recent Labs  Lab 01/26/24 0557 01/27/24 0412  NA 141 136  K 3.7 3.9  CL 106 106  CO2 23 20*  GLUCOSE 103* 112*  BUN 10 12  CREATININE 0.72 0.60  CALCIUM 9.2 8.4*   GFR: Estimated Creatinine Clearance: 86.8 mL/min (by C-G formula based on SCr of 0.6 mg/dL). Liver Function Tests: Recent Labs  Lab 01/26/24 0557 01/27/24 0412  AST 24 35  ALT <5 <5  ALKPHOS 53 44  BILITOT 0.4 0.5  PROT 7.0 5.8*  ALBUMIN 4.0 3.4*   Recent Labs  Lab 01/26/24 0557  LIPASE 14   No results for input(s): AMMONIA in the last 168 hours. Coagulation Profile: No results for input(s):  INR, PROTIME in the last 168 hours. Cardiac Enzymes: No results for input(s): CKTOTAL, CKMB, CKMBINDEX, TROPONINI in the last 168 hours. BNP (last 3 results) No results for input(s): PROBNP in the last 8760 hours. HbA1C: No results for input(s): HGBA1C in the last 72 hours. CBG: No results for input(s): GLUCAP in the last 168 hours. Lipid Profile: No results for input(s): CHOL, HDL, LDLCALC, TRIG, CHOLHDL, LDLDIRECT in the last 72 hours. Thyroid  Function Tests: No results for input(s): TSH, T4TOTAL, FREET4, T3FREE, THYROIDAB in the last 72 hours. Anemia Panel: No results for input(s): VITAMINB12, FOLATE, FERRITIN, TIBC, IRON, RETICCTPCT in the last 72 hours. Sepsis Labs: No results for input(s): PROCALCITON, LATICACIDVEN in the last 168 hours.  No results found for this or any previous visit (from the past 240 hours).       Radiology Studies: CT ABDOMEN PELVIS W CONTRAST Result Date: 01/26/2024 EXAM: CT ABDOMEN AND PELVIS WITH CONTRAST 01/26/2024 08:38:00 AM TECHNIQUE: CT of the abdomen and pelvis was performed with the administration of intravenous contrast, 100mL (iohexol (OMNIPAQUE) 300 MG/ML solution 100 mL IOHEXOL 300 MG/ML SOLN). Multiplanar reformatted images are provided for review. Automated exposure control, iterative reconstruction, and/or weight-based adjustment of the mA/kV was utilized to reduce the radiation dose to as  low as reasonably achievable. COMPARISON: None available. CLINICAL HISTORY: Patient presents with a complaint of an intra-abdominal mass that shifted, causing abdominal pain. The patient is scheduled for a colonoscopy on Monday for diagnostic purposes. The patient reports that the mass moved, and immediately started having abdominal pain. *tracking code: Bo* FINDINGS: LOWER CHEST: A 10 mm indeterminate pulmonary nodule is seen along the paramediastinal right middle lobe at the visualized right lung base,  along the pericardium, image 14/6. LIVER: Focal fatty hepatic infiltration is seen along the falciform ligament. There is mild hepatic steatosis and mild hepatomegaly. No enhancing intrahepatic mass or intrahepatic or extrahepatic biliary ductal dilation is present. GALLBLADDER AND BILE DUCTS: Macro and CT gallstones are present. The gallbladder is otherwise unremarkable. No biliary ductal dilatation is seen. SPLEEN: No acute abnormality is noted. PANCREAS: No acute abnormality is noted. ADRENAL GLANDS: No acute abnormality is noted. KIDNEYS, URETERS AND BLADDER: Scattered simple cysts are seen within the kidneys bilaterally, for which no follow up imaging is recommended. The kidneys are otherwise unremarkable, with no stones, hydronephrosis, perinephric or periureteral stranding. The urinary bladder is unremarkable. GI AND BOWEL: There is a large colonic stool burden with a focal point of transition involvement in the distal sigmoid colon, where there is slightly irregular mural thickening and luminal narrowing, best seen at image 72/5, sagittal image 102/10, and coronal image 74/8, suspicious for an underlying obstructing colonic mass. Adjacent pathologic adenopathy is seen within the sigmoid colonic mesentery, measuring 1.6 x 2.7 cm at image 66/2, in keeping with regional nodal metastatic disease. Moderate ascites, peritoneal enhancement, and omental infiltration are seen, in keeping with signs of peritoneal carcinomatosis. REPRODUCTIVE ORGANS: Bilateral ovarian masses are seen, with the dominant septate, regularly enhancing mass involving the right ovary, measuring 10.5 x 14.6 x 12.5 cm. On the left, the mixed cystic and solid enhancing mass measures 5 x 3.4 cm. 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 BONES AND SOFT TISSUES: Bilateral L5 pars defects are seen. Osseous structures are age-appropriate, with no acute bone abnormality or  lytic or blastic bone lesion. VASCULATURE: No mention of vascular abnormalities is made. LYMPH NODES: No lymphadenopathy is noted, except for the pathologic adenopathy mentioned in the GI and BOWEL section. PERITONEUM AND RETROPERITONEUM: Moderate ascites is present, with peritoneal enhancement and omental infiltration, in keeping with signs of peritoneal carcinomatosis. IMPRESSION: 1. 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 2. 10 mm indeterminate pulmonary nodule in the right middle lobe. Dedicated nonemergency imaging of the chest is recommended for staging purposes and further evaluation. . Electronically signed by: Dorethia Molt MD 01/26/2024 09:02 AM EDT RP Workstation: [id number]   US  PELVIC DOPPLER (TORSION R/O OR MASS ARTERIAL FLOW) Result Date: 01/26/2024 EXAM: US  Pelvis, Complete Transvaginal and Transabdominal with Doppler 01/26/2024 07:53:00 AM TECHNIQUE: Transabdominal and transvaginal pelvic duplex ultrasound using B-mode/gray scaled imaging with Doppler spectral analysis and color flow was obtained. COMPARISON: None available CLINICAL HISTORY: Pelvic mass. FINDINGS: UTERUS: Uterus measures 9.1 x 4.4 x 5.6 cm, with an estimated volume of 116 ml. Uterus demonstrates normal myometrial echotexture. ENDOMETRIAL STRIPE: Endometrial measures 12 mm. Endometrial stripe is within normal limits. RIGHT OVARY: There is a complex solid and cystic right adnexal mass measuring approximately 18.2 x 9.7 x 14.3 cm. This  mass is mildly vascular. LEFT OVARY: The left ovary measures 3.3 x 1.8 x 2.7 cm, with an estimated volume of 8.6 ml. There is a complex cyst within the left ovary measuring approximately 2.6 x 2.0 x 2.2 cm.  There is normal arterial and venous Doppler flow to the left ovary. FREE FLUID: There is moderate free fluid. IMPRESSION: 1. Complex solid and cystic right adnexal mass measuring approximately 18.2 x 9.7 x 14.3 cm, mildly vascular, concerning for neoplasm. Gynecology consultation is recommended. 2. Complex cyst within the left ovary measuring approximately 2.6 x 2.0 x 2.2 cm. 3. Moderate free fluid. Electronically signed by: Evalene Coho MD 01/26/2024 08:18 AM EDT RP Workstation: [id number]   US  PELVIS (TRANSABDOMINAL ONLY) Result Date: 01/26/2024 EXAM: US  Pelvis, Complete Transvaginal and Transabdominal with Doppler 01/26/2024 07:53:00 AM TECHNIQUE: Transabdominal and transvaginal pelvic duplex ultrasound using B-mode/gray scaled imaging with Doppler spectral analysis and color flow was obtained. COMPARISON: None available CLINICAL HISTORY: Pelvic mass. FINDINGS: UTERUS: Uterus measures 9.1 x 4.4 x 5.6 cm, with an estimated volume of 116 ml. Uterus demonstrates normal myometrial echotexture. ENDOMETRIAL STRIPE: Endometrial measures 12 mm. Endometrial stripe is within normal limits. RIGHT OVARY: There is a complex solid and cystic right adnexal mass measuring approximately 18.2 x 9.7 x 14.3 cm. This mass is mildly vascular. LEFT OVARY: The left ovary measures 3.3 x 1.8 x 2.7 cm, with an estimated volume of 8.6 ml. There is a complex cyst within the left ovary measuring approximately 2.6 x 2.0 x 2.2 cm. There is normal arterial and venous Doppler flow to the left ovary. FREE FLUID: There is moderate free fluid. IMPRESSION: 1. Complex solid and cystic right adnexal mass measuring approximately 18.2 x 9.7 x 14.3 cm, mildly vascular, concerning for neoplasm. Gynecology consultation is recommended. 2. Complex cyst within the left ovary measuring approximately 2.6 x 2.0 x 2.2 cm. 3. Moderate free fluid. Electronically signed by: Evalene Coho MD 01/26/2024 08:18 AM EDT RP Workstation: [id number]            LOS: 0 days   Time spent= 35 mins    Deliliah Room, MD [facility]  If 7PM-7AM, please contact night-coverage  01/27/2024, 9:15 AM

## 2024-01-27 NOTE — Progress Notes (Addendum)
 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  She was seen by our GYN oncologist Dr. Viktoria last week due to a large pelvic mass on outside CT. She presented to hospital yesterday due to worsening constipation and abdominal pain, with mild intermittent nausea.  CT scans showed a bilateral ovarian masses, with the largest involved in the right ovary measuring 14.6 centimeter, and multiple peritoneal nodules, and a probable sigmoid colon mass, sending for metastatic colon cancer.  Her CT chest this morning also showed bilateral multiple lung nodules, which the largest 1.8 cm in right middle lobe, concerning for pulmonary metastasis.  Tumor marker CA125 was slightly elevated at 64.3, and elevated CEA 15.51.  Patient has been seen by GI, and plan for colonoscopy tomorrow.  MEDICAL HISTORY:  Past Medical History:  Diagnosis Date   Asthma 04/09/2019   no medications since 20s   Mixed hyperlipidemia 01/26/2015   Vitamin D  deficiency     SURGICAL HISTORY: Past Surgical History:  Procedure Laterality Date   NO PAST SURGERIES      SOCIAL HISTORY: Social History   Socioeconomic History   Marital status: Married    Spouse name: Not on file   Number of children: 2   Years of education: Not on file   Highest education level: Not on file  Occupational History   Not on file  Tobacco Use   Smoking status: Never   Smokeless tobacco: Never  Vaping Use   Vaping  status: Never Used  Substance and Sexual Activity   Alcohol use: Not Currently   Drug use: No   Sexual activity: Yes    Partners: Male    Birth control/protection: Condom  Other Topics Concern   Not on file  Social History Narrative   Patient is married with 65 and 27 year old children   Employed by [employer] school system   Social Drivers of Health   Financial Resource Strain: Not on file  Food Insecurity: No Food Insecurity (01/26/2024)   Hunger Vital Sign    Worried About Running Out of Food in the Last Year: Never true    Ran Out of Food in the Last Year: Never true  Transportation Needs: No Transportation Needs (01/26/2024)   PRAPARE - Administrator, Civil Service (Medical): No    Lack of Transportation (Non-Medical): No  Physical Activity: Insufficiently Active (04/08/2018)   Exercise Vital Sign    Days of Exercise per Week: 2 days    Minutes of Exercise per Session: 60 min  Stress: No Stress Concern Present (04/08/2018)   [employer] of Occupational Health - Occupational Stress Questionnaire    Feeling of Stress : Not at all  Social Connections: Not on file  Intimate Partner Violence: Not At Risk (01/26/2024)   Humiliation, Afraid, Rape, and Kick questionnaire    Fear of Current or Ex-Partner: No    Emotionally Abused: No    Physically Abused: No    Sexually Abused: No    FAMILY HISTORY: Family History  Problem Relation Age of  Onset   Arthritis Mother        Rhumatoid   Multiple myeloma Father 38   Breast cancer Sister    Lupus Maternal Grandmother    Heart attack Maternal Grandfather    Colon cancer Maternal Grandfather    Heart attack Paternal Grandfather    Heart attack Maternal Uncle 48   Stroke Maternal Uncle    Ovarian cancer Neg Hx    Endometrial cancer Neg Hx    Pancreatic cancer Neg Hx    Prostate cancer Neg Hx     ALLERGIES:  is allergic to pneumovax [pneumococcal polysaccharide vaccine] and  sulfamethoxazole-trimethoprim.  MEDICATIONS:  Current Facility-Administered Medications  Medication Dose Route Frequency Provider Last Rate Last Admin   0.9 %  sodium chloride infusion   Intravenous Continuous Zehr, Jessica D, PA-C 10 mL/hr at 01/27/24 1048 Rate Change at 01/27/24 1048   0.9 %  sodium chloride infusion   Intravenous Continuous Zehr, Jessica D, PA-C       acetaminophen (TYLENOL) tablet 650 mg  650 mg Oral Q6H PRN Celinda Alm Lot, MD       Or   acetaminophen (TYLENOL) suppository 650 mg  650 mg Rectal Q6H PRN Celinda Alm Lot, MD       HYDROmorphone (DILAUDID) injection 1 mg  1 mg Intravenous Q4H PRN Rashid, Farhan, MD   1 mg at 01/27/24 1836   [START ON 01/28/2024] Na Sulfate-K Sulfate-Mg Sulfate concentrate (SUPREP) kit 177 mL  0.5 kit Oral Once Zehr, Jessica D, PA-C       ondansetron (ZOFRAN) tablet 4 mg  4 mg Oral Q6H PRN Celinda Alm Lot, MD       Or   ondansetron (ZOFRAN) injection 4 mg  4 mg Intravenous Q6H PRN Celinda Alm Lot, MD   4 mg at 01/27/24 1836   polyethylene glycol (MIRALAX / GLYCOLAX) packet 17 g  17 g Oral QID Avram Lupita BRAVO, MD   17 g at 01/27/24 2041    REVIEW OF SYSTEMS:   Constitutional: Denies fevers, chills or abnormal night sweats Eyes: Denies blurriness of vision, double vision or watery eyes Ears, nose, mouth, throat, and face: Denies mucositis or sore throat Respiratory: Denies cough, dyspnea or wheezes Cardiovascular: Denies palpitation, chest discomfort or lower extremity swelling Gastrointestinal:  see HPI  Skin: Denies abnormal skin rashes Lymphatics: Denies new lymphadenopathy or easy bruising Neurological:Denies numbness, tingling or new weaknesses Behavioral/Psych: Mood is stable, no new changes  All other systems were reviewed with the patient and are negative.  PHYSICAL EXAMINATION: ECOG PERFORMANCE STATUS: 1 - Symptomatic but completely ambulatory  Vitals:   01/27/24 0911 01/27/24 1643  BP: 111/66 123/77   Pulse: 93 (!) 101  Resp:    Temp: 97.8 F (36.6 C)   SpO2: 100% 97%   There were no vitals filed for this visit.  GENERAL:alert, no distress and comfortable SKIN: skin color, texture, turgor are normal, no rashes or significant lesions EYES: normal, conjunctiva are pink and non-injected, sclera clear OROPHARYNX:no exudate, no erythema and lips, buccal mucosa, and tongue normal  NECK: supple, thyroid  normal size, non-tender, without nodularity LYMPH:  no palpable lymphadenopathy in the cervical, axillary or inguinal LUNGS: clear to auscultation and percussion with normal breathing effort HEART: regular rate & rhythm and no murmurs and no lower extremity edema ABDOMEN:abdomen soft, non-tender and normal bowel sounds Musculoskeletal:no cyanosis of digits and no clubbing  PSYCH: alert & oriented x 3 with fluent speech NEURO: no focal motor/sensory deficits  LABORATORY  DATA:  I have reviewed the data as listed Lab Results  Component Value Date   WBC 13.7 (H) 01/27/2024   HGB 10.2 (L) 01/27/2024   HCT 34.0 (L) 01/27/2024   MCV 84.8 01/27/2024   PLT 468 (H) 01/27/2024   Recent Labs    01/26/24 0557 01/27/24 0412  NA 141 136  K 3.7 3.9  CL 106 106  CO2 23 20*  GLUCOSE 103* 112*  BUN 10 12  CREATININE 0.72 0.60  CALCIUM 9.2 8.4*  GFRNONAA >60 >60  PROT 7.0 5.8*  ALBUMIN 4.0 3.4*  AST 24 35  ALT <5 <5  ALKPHOS 53 44  BILITOT 0.4 0.5    RADIOGRAPHIC STUDIES: I have personally reviewed the radiological images as listed and agreed with the findings in the report. CT CHEST W CONTRAST Result Date: 01/27/2024 CLINICAL DATA:  Known pelvic mass. Indeterminate right middle lobe nodule on CT abdomen and pelvis. * Tracking Code: BO * EXAM: CT CHEST WITH CONTRAST TECHNIQUE: Multidetector CT imaging of the chest was performed during intravenous contrast administration. RADIATION DOSE REDUCTION: This exam was performed according to the departmental dose-optimization program which  includes automated exposure control, adjustment of the mA and/or kV according to patient size and/or use of iterative reconstruction technique. CONTRAST:  75mL OMNIPAQUE IOHEXOL 300 MG/ML  SOLN COMPARISON:  CT abdomen and pelvis dated 01/26/2024 FINDINGS: Cardiovascular: Normal heart size. No significant pericardial fluid/thickening. Great vessels are normal in course and caliber. No central pulmonary emboli. Mediastinum/Nodes: Imaged thyroid  gland without nodules meeting criteria for imaging follow-up by size. Normal esophagus. 11 mm right paratracheal lymphadenopathy (2:48). Lungs/Pleura: The central airways are patent. Bilateral lower lobe relaxation atelectasis. 2.9 x 2.5 cm rounded consolidation along the posterior superior segment right lower lobe (8:67). Scattered pulmonary nodules: -2 mm right apex (8:36) and left upper lobe (8:66) -9 x 5 mm left upper lobe perifissural (8:71) -7 x 4 mm right lower lobe peribronchovascular (8:73) -18 x 8 mm medial right middle lobe (8:93) No pneumothorax. New trace right and small left pleural effusions. Upper abdomen: Moderate volume ascites. Please see separately dictated recent CT abdomen and pelvis report for detailed findings. Musculoskeletal: No acute or abnormal lytic or blastic osseous lesions. IMPRESSION: 1. A 2.9 cm rounded consolidation along the posterior superior segment right lower lobe, suspicious for metastatic disease in the setting of known pelvic mass. Pneumonia can have a similar appearance if there are concurrent infectious symptoms. 2. Scattered pulmonary nodules measuring up to 9 mm in the left upper lobe, indeterminate, but suspicious for metastatic disease. 3. Right paratracheal lymphadenopathy, indeterminate. 4. New trace right and small left pleural effusions. 5. Moderate volume ascites. Electronically Signed   By: Limin  Xu M.D.   On: 01/27/2024 15:59   CT ABDOMEN PELVIS W CONTRAST Result Date: 01/26/2024 EXAM: CT ABDOMEN AND PELVIS WITH  CONTRAST 01/26/2024 08:38:00 AM TECHNIQUE: CT of the abdomen and pelvis was performed with the administration of intravenous contrast, 100mL (iohexol (OMNIPAQUE) 300 MG/ML solution 100 mL IOHEXOL 300 MG/ML SOLN). Multiplanar reformatted images are provided for review. Automated exposure control, iterative reconstruction, and/or weight-based adjustment of the mA/kV was utilized to reduce the radiation dose to as low as reasonably achievable. COMPARISON: None available. CLINICAL HISTORY: Patient presents with a complaint of an intra-abdominal mass that shifted, causing abdominal pain. The patient is scheduled for a colonoscopy on Monday for diagnostic purposes. The patient reports that the mass moved, and immediately started having abdominal pain. *tracking code: Bo* FINDINGS: LOWER CHEST:  A 10 mm indeterminate pulmonary nodule is seen along the paramediastinal right middle lobe at the visualized right lung base, along the pericardium, image 14/6. LIVER: Focal fatty hepatic infiltration is seen along the falciform ligament. There is mild hepatic steatosis and mild hepatomegaly. No enhancing intrahepatic mass or intrahepatic or extrahepatic biliary ductal dilation is present. GALLBLADDER AND BILE DUCTS: Macro and CT gallstones are present. The gallbladder is otherwise unremarkable. No biliary ductal dilatation is seen. SPLEEN: No acute abnormality is noted. PANCREAS: No acute abnormality is noted. ADRENAL GLANDS: No acute abnormality is noted. KIDNEYS, URETERS AND BLADDER: Scattered simple cysts are seen within the kidneys bilaterally, for which no follow up imaging is recommended. The kidneys are otherwise unremarkable, with no stones, hydronephrosis, perinephric or periureteral stranding. The urinary bladder is unremarkable. GI AND BOWEL: There is a large colonic stool burden with a focal point of transition involvement in the distal sigmoid colon, where there is slightly irregular mural thickening and luminal  narrowing, best seen at image 72/5, sagittal image 102/10, and coronal image 74/8, suspicious for an underlying obstructing colonic mass. Adjacent pathologic adenopathy is seen within the sigmoid colonic mesentery, measuring 1.6 x 2.7 cm at image 66/2, in keeping with regional nodal metastatic disease. Moderate ascites, peritoneal enhancement, and omental infiltration are seen, in keeping with signs of peritoneal carcinomatosis. REPRODUCTIVE ORGANS: Bilateral ovarian masses are seen, with the dominant septate, regularly enhancing mass involving the right ovary, measuring 10.5 x 14.6 x 12.5 cm. On the left, the mixed cystic and solid enhancing mass measures 5 x 3.4 cm. 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 BONES AND SOFT TISSUES: Bilateral L5 pars defects are seen. Osseous structures are age-appropriate, with no acute bone abnormality or lytic or blastic bone lesion. VASCULATURE: No mention of vascular abnormalities is made. LYMPH NODES: No lymphadenopathy is noted, except for the pathologic adenopathy mentioned in the GI and BOWEL section. PERITONEUM AND RETROPERITONEUM: Moderate ascites is present, with peritoneal enhancement and omental infiltration, in keeping with signs of peritoneal carcinomatosis. IMPRESSION: 1. 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 2. 10 mm indeterminate pulmonary nodule in the right middle lobe. Dedicated nonemergency imaging of the chest is recommended for staging purposes and further evaluation. . Electronically signed by: Dorethia Molt MD 01/26/2024 09:02 AM EDT RP Workstation: [id number]   US  PELVIC DOPPLER (TORSION  R/O OR MASS ARTERIAL FLOW) Result Date: 01/26/2024 EXAM: US  Pelvis, Complete Transvaginal and Transabdominal with Doppler 01/26/2024 07:53:00 AM TECHNIQUE: Transabdominal and transvaginal pelvic duplex ultrasound using B-mode/gray scaled imaging with Doppler spectral analysis and color flow was obtained. COMPARISON: None available CLINICAL HISTORY: Pelvic mass. FINDINGS: UTERUS: Uterus measures 9.1 x 4.4 x 5.6 cm, with an estimated volume of 116 ml. Uterus demonstrates normal myometrial echotexture. ENDOMETRIAL STRIPE: Endometrial measures 12 mm. Endometrial stripe is within normal limits. RIGHT OVARY: There is a complex solid and cystic right adnexal mass measuring approximately 18.2 x 9.7 x 14.3 cm. This mass is mildly vascular. LEFT OVARY: The left ovary measures 3.3 x 1.8 x 2.7 cm, with an estimated volume of 8.6 ml. There is a complex cyst within the left ovary measuring approximately 2.6 x 2.0 x 2.2 cm. There is normal arterial and venous Doppler flow to the left ovary. FREE FLUID:  There is moderate free fluid. IMPRESSION: 1. Complex solid and cystic right adnexal mass measuring approximately 18.2 x 9.7 x 14.3 cm, mildly vascular, concerning for neoplasm. Gynecology consultation is recommended. 2. Complex cyst within the left ovary measuring approximately 2.6 x 2.0 x 2.2 cm. 3. Moderate free fluid. Electronically signed by: Evalene Coho MD 01/26/2024 08:18 AM EDT RP Workstation: [id number]   US  PELVIS (TRANSABDOMINAL ONLY) Result Date: 01/26/2024 EXAM: US  Pelvis, Complete Transvaginal and Transabdominal with Doppler 01/26/2024 07:53:00 AM TECHNIQUE: Transabdominal and transvaginal pelvic duplex ultrasound using B-mode/gray scaled imaging with Doppler spectral analysis and color flow was obtained. COMPARISON: None available CLINICAL HISTORY: Pelvic mass. FINDINGS: UTERUS: Uterus measures 9.1 x 4.4 x 5.6 cm, with an estimated volume of 116 ml. Uterus demonstrates normal myometrial echotexture.  ENDOMETRIAL STRIPE: Endometrial measures 12 mm. Endometrial stripe is within normal limits. RIGHT OVARY: There is a complex solid and cystic right adnexal mass measuring approximately 18.2 x 9.7 x 14.3 cm. This mass is mildly vascular. LEFT OVARY: The left ovary measures 3.3 x 1.8 x 2.7 cm, with an estimated volume of 8.6 ml. There is a complex cyst within the left ovary measuring approximately 2.6 x 2.0 x 2.2 cm. There is normal arterial and venous Doppler flow to the left ovary. FREE FLUID: There is moderate free fluid. IMPRESSION: 1. Complex solid and cystic right adnexal mass measuring approximately 18.2 x 9.7 x 14.3 cm, mildly vascular, concerning for neoplasm. Gynecology consultation is recommended. 2. Complex cyst within the left ovary measuring approximately 2.6 x 2.0 x 2.2 cm. 3. Moderate free fluid. Electronically signed by: Evalene Coho MD 01/26/2024 08:18 AM EDT RP Workstation: [id number]    ASSESSMENT & PLAN:  48 year old female, without significant past medical history, presented with constipation,Vaginal bleeding, and lower abdominal pain.  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 Mild anemia   Recommendations: -I agree with colonoscopy which is scheduled for tomorrow.  I spoke with Dr. Wilhelmenia  - She does not have radiographic evidence of colonic bowel obstruction.  However if colonoscopy shows near obstruction by colonic mass, she may need stenting or diverting ostomy  - If colonoscopy is negative for primary colon cancer, then we will discuss with Dr. Viktoria to see if she will have right oophorectomy or peritoneal biopsy. - I will follow-up after biopsy result is back, if this is non-GYN primary. -I spoke with her husband and answered all his questions.    All questions were answered. The patient knows to call the clinic with any problems, questions or  concerns.      [name], MD 01/27/2024

## 2024-01-27 NOTE — Progress Notes (Signed)
   01/27/24 1644  TOC Brief Assessment  Insurance and Status Reviewed  Patient has primary care physician Yes  Home environment has been reviewed single family home  Prior level of function: independent  Prior/Current Home Services No current home services  Social Drivers of Health Review SDOH reviewed no interventions necessary  Readmission risk has been reviewed Yes  Transition of care needs no transition of care needs at this time    Signed: Heather Saltness, MSW, LCSW Clinical Social Worker Inpatient Care Management 01/27/2024 4:45 PM

## 2024-01-27 NOTE — Telephone Encounter (Signed)
 FMLA forms received from [facility] benefits dept. Requested information provided and faxed.

## 2024-01-27 NOTE — Progress Notes (Cosign Needed)
 [facility] Progress Note  CC:  Colonic mass and stricture   Subjective:  Feels ok.  Drinking and tolerating the miralax without issues.  Some nausea that is relieved by anti-emetics, no vomiting.  Having bowel movements.  K-pad is helpful with diffuse abdominal pain.  Objective:  Vital signs in last 24 hours: Temp:  [98.2 F (36.8 C)-98.6 F (37 C)] 98.2 F (36.8 C) (10/20 0604) Pulse Rate:  [88-106] 93 (10/20 0911) Resp:  [16-18] 18 (10/20 0604) BP: (111-128)/(66-85) 111/66 (10/20 0911) SpO2:  [96 %-100 %] 100 % (10/20 0911) Last BM Date : 01/26/24 General:  Alert, Well-developed, in NAD Heart:  Regular rate and rhythm; no murmurs Pulm:  CTAB.  No W/R/R. Abdomen:  Soft, slightly distended.  BS present.  Moderate diffuse TTP. Extremities:  Without edema. Neurologic:  Alert and oriented x 4;  grossly normal neurologically. Psych:  Alert and cooperative. Normal mood and affect.  Intake/Output from previous day: 10/19 0701 - 10/20 0700 In: 1242.1 [P.O.:240; I.V.:2.1; IV Piggyback:1000] Out: -   Lab Results: Recent Labs    01/26/24 0557 01/27/24 0412  WBC 8.6 13.7*  HGB 12.7 10.2*  HCT 40.7 34.0*  PLT 586* 468*   BMET Recent Labs    01/26/24 0557 01/27/24 0412  NA 141 136  K 3.7 3.9  CL 106 106  CO2 23 20*  GLUCOSE 103* 112*  BUN 10 12  CREATININE 0.72 0.60  CALCIUM 9.2 8.4*   LFT Recent Labs    01/27/24 0412  PROT 5.8*  ALBUMIN 3.4*  AST 35  ALT <5  ALKPHOS 44  BILITOT 0.5   CT ABDOMEN PELVIS W CONTRAST Result Date: 01/26/2024 EXAM: CT ABDOMEN AND PELVIS WITH CONTRAST 01/26/2024 08:38:00 AM TECHNIQUE: CT of the abdomen and pelvis was performed with the administration of intravenous contrast, 100mL (iohexol (OMNIPAQUE) 300 MG/ML solution 100 mL IOHEXOL 300 MG/ML SOLN). Multiplanar reformatted images are provided for review. Automated exposure control, iterative reconstruction, and/or weight-based adjustment of the mA/kV was utilized  to reduce the radiation dose to as low as reasonably achievable. COMPARISON: None available. CLINICAL HISTORY: Patient presents with a complaint of an intra-abdominal mass that shifted, causing abdominal pain. The patient is scheduled for a colonoscopy on Monday for diagnostic purposes. The patient reports that the mass moved, and immediately started having abdominal pain. *tracking code: Bo* FINDINGS: LOWER CHEST: A 10 mm indeterminate pulmonary nodule is seen along the paramediastinal right middle lobe at the visualized right lung base, along the pericardium, image 14/6. LIVER: Focal fatty hepatic infiltration is seen along the falciform ligament. There is mild hepatic steatosis and mild hepatomegaly. No enhancing intrahepatic mass or intrahepatic or extrahepatic biliary ductal dilation is present. GALLBLADDER AND BILE DUCTS: Macro and CT gallstones are present. The gallbladder is otherwise unremarkable. No biliary ductal dilatation is seen. SPLEEN: No acute abnormality is noted. PANCREAS: No acute abnormality is noted. ADRENAL GLANDS: No acute abnormality is noted. KIDNEYS, URETERS AND BLADDER: Scattered simple cysts are seen within the kidneys bilaterally, for which no follow up imaging is recommended. The kidneys are otherwise unremarkable, with no stones, hydronephrosis, perinephric or periureteral stranding. The urinary bladder is unremarkable. GI AND BOWEL: There is a large colonic stool burden with a focal point of transition involvement in the distal sigmoid colon, where there is slightly irregular mural thickening and luminal narrowing, best seen at image 72/5, sagittal image 102/10, and coronal image 74/8, suspicious for an underlying obstructing colonic mass. Adjacent pathologic adenopathy  is seen within the sigmoid colonic mesentery, measuring 1.6 x 2.7 cm at image 66/2, in keeping with regional nodal metastatic disease. Moderate ascites, peritoneal enhancement, and omental infiltration are seen, in  keeping with signs of peritoneal carcinomatosis. REPRODUCTIVE ORGANS: Bilateral ovarian masses are seen, with the dominant septate, regularly enhancing mass involving the right ovary, measuring 10.5 x 14.6 x 12.5 cm. On the left, the mixed cystic and solid enhancing mass measures 5 x 3.4 cm. 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 BONES AND SOFT TISSUES: Bilateral L5 pars defects are seen. Osseous structures are age-appropriate, with no acute bone abnormality or lytic or blastic bone lesion. VASCULATURE: No mention of vascular abnormalities is made. LYMPH NODES: No lymphadenopathy is noted, except for the pathologic adenopathy mentioned in the GI and BOWEL section. PERITONEUM AND RETROPERITONEUM: Moderate ascites is present, with peritoneal enhancement and omental infiltration, in keeping with signs of peritoneal carcinomatosis. IMPRESSION: 1. 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 2. 10 mm indeterminate pulmonary nodule in the right middle lobe. Dedicated nonemergency imaging of the chest is recommended for staging purposes and further evaluation. . Electronically signed by: Dorethia Molt MD 01/26/2024 09:02 AM EDT RP Workstation: [id number]   US  PELVIC DOPPLER (TORSION R/O OR MASS ARTERIAL FLOW) Result Date: 01/26/2024 EXAM: US  Pelvis, Complete Transvaginal and Transabdominal with Doppler 01/26/2024 07:53:00 AM TECHNIQUE: Transabdominal and transvaginal pelvic duplex ultrasound using B-mode/gray scaled imaging with Doppler spectral analysis and color flow was obtained. COMPARISON: None available CLINICAL HISTORY: Pelvic mass. FINDINGS: UTERUS:  Uterus measures 9.1 x 4.4 x 5.6 cm, with an estimated volume of 116 ml. Uterus demonstrates normal myometrial echotexture. ENDOMETRIAL STRIPE: Endometrial measures 12 mm. Endometrial stripe is within normal limits. RIGHT OVARY: There is a complex solid and cystic right adnexal mass measuring approximately 18.2 x 9.7 x 14.3 cm. This mass is mildly vascular. LEFT OVARY: The left ovary measures 3.3 x 1.8 x 2.7 cm, with an estimated volume of 8.6 ml. There is a complex cyst within the left ovary measuring approximately 2.6 x 2.0 x 2.2 cm. There is normal arterial and venous Doppler flow to the left ovary. FREE FLUID: There is moderate free fluid. IMPRESSION: 1. Complex solid and cystic right adnexal mass measuring approximately 18.2 x 9.7 x 14.3 cm, mildly vascular, concerning for neoplasm. Gynecology consultation is recommended. 2. Complex cyst within the left ovary measuring approximately 2.6 x 2.0 x 2.2 cm. 3. Moderate free fluid. Electronically signed by: Evalene Coho MD 01/26/2024 08:18 AM EDT RP Workstation: [id number]   US  PELVIS (TRANSABDOMINAL ONLY) Result Date: 01/26/2024 EXAM: US  Pelvis, Complete Transvaginal and Transabdominal with Doppler 01/26/2024 07:53:00 AM TECHNIQUE: Transabdominal and transvaginal pelvic duplex ultrasound using B-mode/gray scaled imaging with Doppler spectral analysis and color flow was obtained. COMPARISON: None available CLINICAL HISTORY: Pelvic mass. FINDINGS: UTERUS: Uterus measures 9.1 x 4.4 x 5.6 cm, with an estimated volume of 116 ml. Uterus demonstrates normal myometrial echotexture. ENDOMETRIAL STRIPE: Endometrial measures 12 mm. Endometrial stripe is within normal limits. RIGHT OVARY: There is a complex solid and cystic right adnexal mass measuring approximately 18.2 x 9.7 x 14.3 cm. This mass is mildly vascular. LEFT OVARY: The left ovary measures 3.3 x 1.8 x 2.7 cm, with an estimated volume of 8.6 ml. There  is a complex cyst within the left ovary measuring  approximately 2.6 x 2.0 x 2.2 cm. There is normal arterial and venous Doppler flow to the left ovary. FREE FLUID: There is moderate free fluid. IMPRESSION: 1. Complex solid and cystic right adnexal mass measuring approximately 18.2 x 9.7 x 14.3 cm, mildly vascular, concerning for neoplasm. Gynecology consultation is recommended. 2. Complex cyst within the left ovary measuring approximately 2.6 x 2.0 x 2.2 cm. 3. Moderate free fluid. 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  Leukocytosis with WBC count of 13.7K.  -Colonoscopy vs sigmoidoscopy 10/21.  She has tolerated the miralax so far and is having bowel movements so hopeful that we can prep her well.    LOS: 0 days   Abigail Hicks. Zehr  01/27/2024, 9:14 AM    Attending Physician's Attestation   I have taken an interval history, reviewed the chart and examined the patient.   Extensive discussion and review of imaging last week and again this week.  Discussed with patient and husband as well as oncology this afternoon upon evaluation.  Plan for increased risk colonoscopy attempt tomorrow to evaluate the colonic mass/pelvic mass.  The risks of an EUS including intestinal perforation, bleeding, infection, aspiration, and medication effects were discussed as was the possibility it may not give a definitive diagnosis if a biopsy is performed.  When a biopsy of the pancreas is done as part of the EUS, there is an additional risk of pancreatitis at the rate of about 1-2%.  It was explained that procedure related pancreatitis is typically mild, although it can be severe and even life threatening, which is why we do not perform random pancreatic biopsies  and only biopsy a lesion/area we feel is concerning enough to warrant the risk.  Possibility of enteral stenting can be made, though if this is an extrinsic lesion, migration of stents is more likely to occur rather than if this is an intrinsic.  Will see what we may be able to pursue and if we are unable to perform full colonoscopy, then sigmoidoscopy at minimum will be completed.  She is tolerating her bowel preparation currently.  Abdominal exam is relatively benign though palpation of the mass is noted.  All patient questions were answered to the best of my ability, and the patient agrees to the aforementioned plan of action with follow-up as indicated.   I agree with the Advanced Practitioner's note, impression, and recommendations with updates and my documentation as noted above.  The majority of the medical decision making/process, formulation of the impression/plan of action for the patient were performed by me with substantive portion of this encounter (>50% time spent including complete performance of at least one of the key components of MDM, History, and/or Exam).   Aloha Finner, MD [facility] Advanced [facility]oscopy Office # [phone]

## 2024-01-27 NOTE — Consult Note (Signed)
 Gynecologic Oncology   History of Present Illness:  Abigail Hicks is a 48 y.o. female who was seen in consultation at the request of Refugio Sing, FNP for an evaluation of a pelvic mass.   The patient was seen in early October 2025 with abdominal pain and constipation beginning in June. Also has noted an abdominal mass, increasing in size. She has AUB, bleeding almost daily.    CT A/P on 01/17/24 revealed: 1. 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 Ascites, concerning for malignancy  3. Right hydroureteronephrosis to the level of the pelvic mass compatible with obstruction  4. Left ovary thought present in left anterior pelvis and also contains multiple cysts, largest 2.8 cm  5. Fecal retention  6. Cholelithiasis  7. Other findings as above    At her recent consultation, the patient noted that in March 2024, her husband said she began having intermittent complaints like pain.  About 6 months ago, she began having 2 menses a month.  Denies any intermenstrual bleeding.  In June or July, she had a brief sharp pain in her right side that resolved without intervention.  Approximately 2-4 weeks ago, she began noticing abdominal discomfort and distention, decreased appetite, early satiety, and back pain.  She also has intermittent constipation and diarrhea or loose stools, which is not her baseline.  She also reports some urinary urge incontinence. Patient endorsed having good energy.  She had been working on losing weight.  Weighed 176 pounds 1-2 years ago, has been 155 pounds for the last few months.  The patient was planned to have direct admission on Monday, January 27, 2024 for begin bowel preparation for a colonoscopy inpatient on January 28, 2024 with major surgery with Dr. Viktoria planned for January 29, 2024. She ended up seeking evaluation in the ER one day prior to this, on 01/26/2024, with complaints of worsening abdominal pain  and constipation. GI was consulted during this time with recommendation for admission. She did have a CT AP with contrast revealing: IMPRESSION: 1. 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 2. 10 mm indeterminate pulmonary nodule in the right middle lobe. Dedicated nonemergency imaging of the chest is recommended for staging purposes and further evaluation.  Pelvic US  was also performed returning with: 1. Complex solid and cystic right adnexal mass measuring approximately 18.2 x 9.7 x 14.3 cm, mildly vascular, concerning for neoplasm. Gynecology consultation is recommended. 2. Complex cyst within the left ovary measuring approximately 2.6 x 2.0 x 2.2 cm. 3. Moderate free fluid.  She did undergo CT imaging of the chest this am with results pending. CBC this am with WBC at 13.7, Hgb 10.2, Hct 34, PLT 468.  Interval: She reports having nausea and using antiemetics. No emesis. Pain medication is wearing off and she is starting to have more abdominal pain. Has had some liquid and formed stool pass. States she underwent a CT chest yesterday due to having difficulty taking a deep breath related to abdominal pain/masses. Husband at the bedside.  Exam:    01/27/2024    9:11 AM 01/27/2024    6:04 AM 01/26/2024    7:34 PM  Vitals with BMI  Systolic 111 128 871  Diastolic 66 76 72  Pulse 93 88 99  Alert, oriented, resting in bed, in no acute distress Unlabored breathing.  Exam performed by Dr. Eldonna. Abdomen distended, slightly tender on palpation. No lower extremity edema noted.  CBC    Component Value Date/Time   WBC 13.7 (H) 01/27/2024 0412   RBC 4.01 01/27/2024 0412   HGB 10.2 (L) 01/27/2024 0412    HCT 34.0 (L) 01/27/2024 0412   PLT 468 (H) 01/27/2024 0412   MCV 84.8 01/27/2024 0412   MCH 25.4 (L) 01/27/2024 0412   MCHC 30.0 01/27/2024 0412   RDW 12.4 01/27/2024 0412   LYMPHSABS 1,630 04/09/2019 1040   MONOABS 496 02/23/2016 1524   EOSABS 63 04/09/2019 1040   BASOSABS 63 04/09/2019 1040      Latest Ref Rng & Units 01/27/2024    4:12 AM 01/26/2024    5:57 AM 04/09/2019   10:40 AM  CMP  Glucose 70 - 99 mg/dL 887  896  87   BUN 6 - 20 mg/dL 12  10  12    Creatinine 0.44 - 1.00 mg/dL 9.39  9.27  9.22   Sodium 135 - 145 mmol/L 136  141  138   Potassium 3.5 - 5.1 mmol/L 3.9  3.7  4.1   Chloride 98 - 111 mmol/L 106  106  104   CO2 22 - 32 mmol/L 20  23  28    Calcium 8.9 - 10.3 mg/dL 8.4  9.2  9.6   Total Protein 6.5 - 8.1 g/dL 5.8  7.0  7.4   Total Bilirubin 0.0 - 1.2 mg/dL 0.5  0.4  0.5   Alkaline Phos 38 - 126 U/L 44  53    AST 15 - 41 U/L 35  24  15   ALT 0 - 44 U/L <5  <5  11     CEA on 01/23/24 at 15.51. CA 125 on 01/23/2024 at 64.3.  Assessment/Plan: 48 year old female currently admitted receiving bowel preparation for colonoscopy scheduled for tomorrow. On CT imaging, findings suspicious for obstructing colonic mass, regional adenopathy, peritoneal carcinomatosis, bilateral ovarian masses.   Proceed with plan for colonoscopy vs sigmoidoscopy tomorrow by GI with Dr. Viktoria requesting biopsy be sent for frozen section if possible. Surgery scheduled for Wednesday unlikely to take place but will remain scheduled pending biopsy results. Dr. Viktoria has talked with Dr. Teresa with [facility] who states if colon cancer present, no surgery recommended at this time with treatment recommendation for chemotherapy. She has also messaged Dr. Cloretta and Dr. Lanny, [facility]. See addition to note from Dr. Eldonna.

## 2024-01-27 NOTE — Patient Instructions (Addendum)
 SURGICAL WAITING ROOM VISITATION Patients having surgery or a procedure may have no more than 2 support people in the waiting area - these visitors may rotate.    Children under the age of 93 must have an adult with them who is not the patient.  If the patient needs to stay at the hospital during part of their recovery, the visitor guidelines for inpatient rooms apply. Pre-op nurse will coordinate an appropriate time for 1 support person to accompany patient in pre-op.  This support person may not rotate.    Please refer to the [facility] website for the visitor guidelines for Inpatients (after your surgery is over and you are in a regular room).       Your procedure is scheduled on: 01-29-24   Report to [facility] Main Entrance    Report to admitting at 8:15 AM   Call this number if you have problems the morning of surgery [phone]   Do not eat food :After Midnight.   After Midnight you may have the following liquids until 7:20 AM DAY OF SURGERY  Water Non-Citrus Juices (without pulp, NO RED-Apple, White grape, White cranberry) Black Coffee (NO MILK/CREAM OR CREAMERS, sugar ok)  Clear Tea (NO MILK/CREAM OR CREAMERS, sugar ok) regular and decaf                             Plain Jell-O (NO RED)                                           Fruit ices (not with fruit pulp, NO RED)                                     Popsicles (NO RED)                                                               Sports drinks like Gatorade (NO RED)                 If you have questions, please contact your surgeon's office.   FOLLOW BOWEL PREP AND ANY ADDITIONAL PRE OP INSTRUCTIONS YOU RECEIVED FROM YOUR SURGEON'S OFFICE!!!     Oral Hygiene is also important to reduce your risk of infection.                                    Remember - BRUSH YOUR TEETH THE MORNING OF SURGERY WITH YOUR REGULAR TOOTHPASTE   Do NOT smoke after Midnight   Take these medicines the morning of surgery  with A SIP OF WATER:    Oxycodone if needed  Stop all vitamins and herbal supplements 7 days before surgery  Bring CPAP mask and tubing day of surgery.                              You may not have any metal on your body including  hair pins, jewelry, and body piercing             Do not wear make-up, lotions, powders, perfumes, or deodorant  Do not wear nail polish including gel and S&S, artificial/acrylic nails, or any other type of covering on natural nails including finger and toenails. If you have artificial nails, gel coating, etc. that needs to be removed by a nail salon please have this removed prior to surgery or surgery may need to be canceled/ delayed if the surgeon/ anesthesia feels like they are unable to be safely monitored.   Do not shave  48 hours prior to surgery.           Do not bring valuables to the hospital. [facility] IS NOT RESPONSIBLE   FOR VALUABLES.   Contacts, dentures or bridgework may not be worn into surgery.   Bring small overnight bag day of surgery.   DO NOT BRING YOUR HOME MEDICATIONS TO THE HOSPITAL. PHARMACY WILL DISPENSE MEDICATIONS LISTED ON YOUR MEDICATION LIST TO YOU DURING YOUR ADMISSION IN THE HOSPITAL!   Special Instructions: Bring a copy of your healthcare power of attorney and living will documents the day of surgery if you haven't scanned them before.              Please read over the following fact sheets you were given: IF YOU HAVE QUESTIONS ABOUT YOUR PRE-OP INSTRUCTIONS PLEASE CALL [phone] [facility]  If you received a COVID test during your pre-op visit  it is requested that you wear a mask when out in public, stay away from anyone that may not be feeling well and notify your surgeon if you develop symptoms. If you test positive for Covid or have been in contact with anyone that has tested positive in the last 10 days please notify you surgeon.  [facility] - Preparing for Surgery Before surgery, you can play an important role.   Because skin is not sterile, your skin needs to be as free of germs as possible.  You can reduce the number of germs on your skin by washing with CHG (chlorahexidine gluconate) soap before surgery.  CHG is an antiseptic cleaner which kills germs and bonds with the skin to continue killing germs even after washing. Please DO NOT use if you have an allergy to CHG or antibacterial soaps.  If your skin becomes reddened/irritated stop using the CHG and inform your nurse when you arrive at Short Stay. Do not shave (including legs and underarms) for at least 48 hours prior to the first CHG shower.  You may shave your face/neck.  Please follow these instructions carefully:  1.  Shower with CHG Soap the night before surgery ONLY (DO NOT USE THE SOAP THE MORNING OF SURGERY).  2.  If you choose to wash your hair, wash your hair first as usual with your normal  shampoo.  3.  After you shampoo, rinse your hair and body thoroughly to remove the shampoo.                             4.  Use CHG as you would any other liquid soap.  You can apply chg directly to the skin and wash.  Gently with a scrungie or clean washcloth.  5.  Apply the CHG Soap to your body ONLY FROM THE NECK DOWN.   Do   not use on face/ open  Wound or open sores. Avoid contact with eyes, ears mouth and   genitals (private parts).                       Wash face,  Genitals (private parts) with your normal soap.             6.  Wash thoroughly, paying special attention to the area where your    surgery  will be performed.  7.  Thoroughly rinse your body with warm water from the neck down.  8.  DO NOT shower/wash with your normal soap after using and rinsing off the CHG Soap.                9.  Pat yourself dry with a clean towel.            10.  Wear clean pajamas.            11.  Place clean sheets on your bed the night of your first shower and do not  sleep with pets. Day of Surgery : Do not apply any CHG,  lotions/deodorants the morning of surgery.  Please wear clean clothes to the hospital/surgery center.  FAILURE TO FOLLOW THESE INSTRUCTIONS MAY RESULT IN THE CANCELLATION OF YOUR SURGERY  PATIENT SIGNATURE_________________________________  NURSE SIGNATURE__________________________________  ________________________________________________________________________

## 2024-01-28 ENCOUNTER — Ambulatory Visit (HOSPITAL_COMMUNITY): Admission: RE | Admit: 2024-01-28 | Source: Home / Self Care | Admitting: Gastroenterology

## 2024-01-28 ENCOUNTER — Encounter (HOSPITAL_COMMUNITY)
Admission: EM | Disposition: A | Discharge status: Home / Self Care | Payer: Self-pay | Source: Home / Self Care | Attending: Internal Medicine

## 2024-01-28 ENCOUNTER — Inpatient Hospital Stay (HOSPITAL_COMMUNITY): Admitting: Certified Registered Nurse Anesthetist

## 2024-01-28 ENCOUNTER — Encounter (HOSPITAL_COMMUNITY): Payer: Self-pay

## 2024-01-28 ENCOUNTER — Encounter (HOSPITAL_COMMUNITY)
Admission: RE | Admit: 2024-01-28 | Discharge: 2024-01-28 | Disposition: A | Discharge status: Home / Self Care | Source: Ambulatory Visit | Attending: Physician Assistant | Admitting: Physician Assistant

## 2024-01-28 ENCOUNTER — Other Ambulatory Visit: Payer: Self-pay

## 2024-01-28 DIAGNOSIS — K641 Second degree hemorrhoids: Secondary | ICD-10-CM

## 2024-01-28 DIAGNOSIS — C19 Malignant neoplasm of rectosigmoid junction: Secondary | ICD-10-CM

## 2024-01-28 DIAGNOSIS — K6389 Other specified diseases of intestine: Secondary | ICD-10-CM | POA: Diagnosis not present

## 2024-01-28 DIAGNOSIS — R1900 Intra-abdominal and pelvic swelling, mass and lump, unspecified site: Secondary | ICD-10-CM

## 2024-01-28 DIAGNOSIS — R933 Abnormal findings on diagnostic imaging of other parts of digestive tract: Secondary | ICD-10-CM

## 2024-01-28 DIAGNOSIS — K5669 Other partial intestinal obstruction: Secondary | ICD-10-CM

## 2024-01-28 DIAGNOSIS — R1084 Generalized abdominal pain: Principal | ICD-10-CM

## 2024-01-28 DIAGNOSIS — R970 Elevated carcinoembryonic antigen [CEA]: Secondary | ICD-10-CM

## 2024-01-28 DIAGNOSIS — Q438 Other specified congenital malformations of intestine: Secondary | ICD-10-CM

## 2024-01-28 HISTORY — PX: COLONOSCOPY: SHX5424

## 2024-01-28 SURGERY — COLONOSCOPY
Anesthesia: Monitor Anesthesia Care

## 2024-01-28 MED ORDER — ALUM & MAG HYDROXIDE-SIMETH 200-200-20 MG/5ML PO SUSP
15.0000 mL | Freq: Four times a day (QID) | ORAL | Status: DC | PRN
  Administered 2024-01-28: 15 mL via ORAL
  Filled 2024-01-28: qty 30

## 2024-01-28 MED ORDER — SODIUM CHLORIDE 0.9 % IV SOLN
INTRAVENOUS | Status: AC | PRN
  Administered 2024-01-28: 500 mL via INTRAMUSCULAR

## 2024-01-28 MED ORDER — SPOT INK MARKER SYRINGE KIT
PACK | SUBMUCOSAL | Status: AC
  Filled 2024-01-28: qty 5

## 2024-01-28 MED ORDER — PROPOFOL 10 MG/ML IV BOLUS
INTRAVENOUS | Status: DC | PRN
  Administered 2024-01-28 (×3): 20 mg via INTRAVENOUS

## 2024-01-28 MED ORDER — POLYETHYLENE GLYCOL 3350 17 G PO PACK
17.0000 g | PACK | Freq: Three times a day (TID) | ORAL | Status: DC
  Administered 2024-01-28 (×3): 17 g via ORAL
  Filled 2024-01-28 (×3): qty 1

## 2024-01-28 MED ORDER — TRAMADOL HCL 50 MG PO TABS
50.0000 mg | ORAL_TABLET | Freq: Four times a day (QID) | ORAL | Status: DC | PRN
  Administered 2024-01-28 – 2024-01-29 (×4): 50 mg via ORAL
  Filled 2024-01-28 (×4): qty 1

## 2024-01-28 MED ORDER — KETOROLAC TROMETHAMINE 30 MG/ML IJ SOLN
30.0000 mg | Freq: Four times a day (QID) | INTRAMUSCULAR | Status: DC | PRN
  Administered 2024-01-28: 30 mg via INTRAVENOUS
  Filled 2024-01-28: qty 1

## 2024-01-28 MED ORDER — PROPOFOL 500 MG/50ML IV EMUL
INTRAVENOUS | Status: DC | PRN
  Administered 2024-01-28: 180 ug/kg/min via INTRAVENOUS

## 2024-01-28 MED ORDER — SPOT INK MARKER SYRINGE KIT
PACK | SUBMUCOSAL | Status: DC | PRN
  Administered 2024-01-28: 1 mL via SUBMUCOSAL

## 2024-01-28 MED ORDER — CARMEX CLASSIC LIP BALM EX OINT
TOPICAL_OINTMENT | CUTANEOUS | Status: DC | PRN
  Filled 2024-01-28: qty 10

## 2024-01-28 MED ORDER — LIDOCAINE 2% (20 MG/ML) 5 ML SYRINGE
INTRAMUSCULAR | Status: DC | PRN
  Administered 2024-01-28: 40 mg via INTRAVENOUS

## 2024-01-28 NOTE — Anesthesia Preprocedure Evaluation (Addendum)
 Anesthesia Evaluation  Patient identified by MRN, date of birth, ID band Patient awake    Reviewed: Allergy & Precautions, H&P , NPO status , Patient's Chart, lab work & pertinent test results  Airway Mallampati: II  TM Distance: >3 FB Neck ROM: Full    Dental no notable dental hx.    Pulmonary asthma , neg sleep apnea   Pulmonary exam normal breath sounds clear to auscultation       Cardiovascular (-) Past MI negative cardio ROS Normal cardiovascular exam Rhythm:Regular Rate:Normal     Neuro/Psych neg Seizures negative neurological ROS  negative psych ROS   GI/Hepatic Neg liver ROS,,,Colonic mass   Endo/Other  negative endocrine ROS    Renal/GU negative Renal ROS     Musculoskeletal negative musculoskeletal ROS (+)    Abdominal   Peds negative pediatric ROS (+)  Hematology negative hematology ROS (+)   Anesthesia Other Findings Large pelvic mass  Reproductive/Obstetrics negative OB ROS                              Anesthesia Physical Anesthesia Plan  ASA: 2  Anesthesia Plan: MAC   Post-op Pain Management:    Induction: Intravenous  PONV Risk Score and Plan: 2 and TIVA and Treatment may vary due to age or medical condition  Airway Management Planned: Natural Airway and Simple Face Mask  Additional Equipment: None  Intra-op Plan:   Post-operative Plan:   Informed Consent: I have reviewed the patients History and Physical, chart, labs and discussed the procedure including the risks, benefits and alternatives for the proposed anesthesia with the patient or authorized representative who has indicated his/her understanding and acceptance.     Dental advisory given  Plan Discussed with: CRNA  Anesthesia Plan Comments:          Anesthesia Quick Evaluation

## 2024-01-28 NOTE — Transfer of Care (Signed)
 Immediate Anesthesia Transfer of Care Note  Patient: Abigail Hicks  Procedure(s) Performed: COLONOSCOPY  Patient Location: Endoscopy Unit  Anesthesia Type:MAC  Level of Consciousness: sedated  Airway & Oxygen Therapy: Patient Spontanous Breathing and Patient connected to face mask oxygen  Post-op Assessment: Report given to RN and Post -op Vital signs reviewed and stable  Post vital signs: Reviewed and stable  Last Vitals:  Vitals Value Taken Time  BP    Temp    Pulse 87 01/28/24 09:36  Resp 18 01/28/24 09:36  SpO2 100 % 01/28/24 09:36  Vitals shown include unfiled device data.  Last Pain:  Vitals:   01/28/24 0827  TempSrc: Temporal  PainSc: 8       Patients Stated Pain Goal: 2 (01/27/24 2054)  Complications: No notable events documented.

## 2024-01-28 NOTE — Op Note (Signed)
 [facility] Patient Name: Abigail Hicks Procedure Date: 01/28/2024 MRN: [id number] Attending MD: Aloha Finner , MD, [id number] Date of Birth: 12/20/1975 CSN: [id number] Age: 48 Admit Type: Inpatient Procedure:                Colonoscopy Indications:              Evaluation of abnormal imaging study of the                            gastrointestinal tract (likely to be clinically                            significant)                           , Generalized abdominal pain, Incidental change in                            bowel habits noted Providers:                Aloha Finner, MD, Randall Lines, RN, [facility]                            Pet[place]ord, Technician, Leighton Agent, CRNA Referring MD:              Medicines:                Monitored Anesthesia Care Complications:            No immediate complications. Estimated Blood Loss:     Estimated blood loss was minimal. Procedure:                Pre-Anesthesia Assessment:                           - Prior to the procedure, a History and Physical                            was performed, and patient medications and                            allergies were reviewed. The patient's tolerance of                            previous anesthesia was also reviewed. The risks                            and benefits of the procedure and the sedation                            options and risks were discussed with the patient.                            All questions were answered, and informed consent                            was obtained. Prior Anticoagulants: The patient  has                            taken no anticoagulant or antiplatelet agents. ASA                            Grade Assessment: III - A patient with severe                            systemic disease. After reviewing the risks and                            benefits, the patient was deemed in satisfactory                            condition to undergo  the procedure.                           After obtaining informed consent, the colonoscope                            was passed under direct vision. Throughout the                            procedure, the patient's blood pressure, pulse, and                            oxygen saturations were monitored continuously. The                            PCF-H190TL ([id number]) Olympus Colonoscope was                            introduced through the anus and advanced to the the                            hepatic flexure to examine a stenosis. This was the                            intended extent. The colonoscopy was performed                            without difficulty. The patient tolerated the                            procedure. The quality of the bowel preparation was                            inadequate. Scope In: 9:01:58 AM Scope Out: 9:30:57 AM Scope Withdrawal Time: 0 hours 16 minutes 17 seconds  Total Procedure Duration: 0 hours 28 minutes 59 seconds  Findings:      The digital rectal exam findings include hemorrhoids. Pertinent       negatives include no palpable rectal lesions.      A large amount of  semi-liquid stool was found in the entire colon,       interfering with visualization. Lavage of the area was performed using       copious amounts, resulting in incomplete clearance with fair       visualization.      The colon (entire examined portion) was grossly redundant leading to       looping (this is in effect as a result of the lesion described below       causing significant dilation of upstream colon).      A frond-like/villous, fungating, infiltrative and ulcerated partially       obstructing large mass was found in the recto-sigmoid colon, in the       sigmoid colon, at 14 cm proximal to the anus and at 20 cm proximal to       the anus. The mass was circumferential. The mass measured six cm in       length. In addition, its diameter measured ten mm. Oozing was present.        Biopsies were taken with a cold forceps for histology. Area was tattooed       with an injection of Spot (carbon black).      Normal mucosa was found in the rectum.      Non-bleeding non-thrombosed external and internal hemorrhoids were found       during retroflexion, during perianal exam and during digital exam. The       hemorrhoids were Grade II (internal hemorrhoids that prolapse but reduce       spontaneously). Impression:               - Stool in the entire examined colon. Preparation                            of the colon was inadequate even after copious                            lavage.                           - Hemorrhoids found on digital rectal exam.                           - Redundant looping colon.                           - Rule out malignancy, partially obstructing tumor                            in the recto-sigmoid colon, in the sigmoid colon,                            at 14 cm proximal to the anus and at 20 cm proximal                            to the anus. Biopsied. Tattooed.                           - Normal mucosa in the rectum.                           -  Non-bleeding non-thrombosed external and internal                            hemorrhoids. Moderate Sedation:      Not Applicable - Patient had care per Anesthesia. Recommendation:           - The patient will be observed post-procedure,                            until all discharge criteria are met.                           - Discharge patient to home.                           - Patient has a contact number available for                            emergencies. The signs and symptoms of potential                            delayed complications were discussed with the                            patient. Return to normal activities tomorrow.                            Written discharge instructions were provided to the                            patient.                           - Full liquid  diet.                           - Continue present medications.                           - Await pathology results.                           - In the setting of the patient's significant                            obstruction she has developed redundancy and                            significant looping as noted on imaging as well.                            Full colonoscopy can be considered when                            decompression occurs (surgically versus enteral  stent at some point in future to evaluate the right                            colon).                           - The findings and recommendations were discussed                            with the patient.                           - The findings and recommendations were discussed                            with the patient's family.                           - The findings and recommendations were discussed                            with the referring physician. Procedure Code(s):        --- Professional ---                           [phone], 52, Colonoscopy, flexible; with biopsy,                            single or multiple                           [id number], 52, Colonoscopy, flexible; with directed                            submucosal injection(s), any substance Diagnosis Code(s):        --- Professional ---                           K64.1, Second degree hemorrhoids                           D49.0, Neoplasm of unspecified behavior of                            digestive system                           K56.690, Other partial intestinal obstruction                           R10.84, Generalized abdominal pain                           R93.3, Abnormal findings on diagnostic imaging of                            other parts of digestive tract  Q43.8, Other specified congenital malformations of                            intestine CPT copyright 2022 American Medical  Association. All rights reserved. The codes documented in this report are preliminary and upon coder review may  be revised to meet current compliance requirements. Aloha Finner, MD 01/28/2024 9:44:58 AM Number of Addenda: 0

## 2024-01-28 NOTE — Progress Notes (Signed)
 PROGRESS NOTE    SHERE EISENHART  FMW:[id number] DOB: [birth year] DOA: 01/26/2024 PCP: [name]   Brief Narrative:   48 y.o. female with medical history significant of acute urinary retention episode, asthma, vitamin D  deficiency who presented to the emergency department complaints of abdominal pain constipation.  She was scheduled to be admitted on 10/20 by GYN oncology (Dr. Comer Dollar) for bilateral ovarian masses further evaluation and laparoscopic procedure versus robotic assisted salpingo-oophorectomy, contralateral oophorectomy and possible total hysterectomy, but presented this morning to the emergency department with 3-day history of constipation and sudden onset of abdominal pain.  US  pelvic complete transvaginal/transabdominal ultrasound with Doppler showed complex solid and 60 right adnexal mass measuring approximately 18.2 x 9.7 x 14.3 cm, mildly vascular, concerning for neoplasm.  There is a complex cyst within the left ovary measuring 2.6 x 2.0 x 2.2 cm.  Moderate free fluid.  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  10 mm indeterminate pulmonary nodule in the right middle lobe   Consults Gyn-Oncology GI Oncology  Assessment & Plan:  Principal Problem:   Colonic mass Active Problems:   Vitamin D  deficiency   Mixed hyperlipidemia   Asthma   Thrombocytosis   Hyperglycemia   Bilateral tubo-ovarian mass   Constipation   Pelvic mass   Generalized abdominal pain   Elevated CEA   B/l ovarian masses:  -US  pelvic complete transvaginal/transabdominal ultrasound with Doppler showed complex solid and 60 right adnexal mass measuring approximately 18.2 x 9.7 x 14.3 cm,  mildly vascular, concerning for neoplasm.  There is a complex cyst within the left ovary measuring 2.6 x 2.0 x 2.2 cm.  Moderate free fluid.  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 -GI on board. -Colonoscopy this AM with multiple biopsies taken. See Op Note for details. -- Follow pathology results. -On OR schedule with GynOnc tomorrow in case of need for diagnostic laparoscopy for biopsies, if today's endoscopic biopsies are non-diagnostic -F/u gyn onc recommendations   Pulmonary nodule,POA: CT chest with contrast done 10/20  Thrombocytosis: f/u closely. May be reactive in the setting of underlying malignancy  Vitamin D  deficiency: continue with Vitamin D  supplementation  Asthma: No acute issues  Disposition: Home, lives with her husband and has two children.   DVT prophylaxis: SCDs Start: 01/26/24 1652     Code Status: Full Code   Family Communication:  Spouse at bedside on rounds today  Status is: Inpatient Ongoing evaluation as above     Subjective:  Pt seen this AM, spouse at bedside, after colonoscopy.  She reports feeling okay.  Ongoing abdominal discomfort, using aquathermia.  Requesting tramadol and/or toradol in place of stronger pain meds.    Examination:  General exam: awake, alert, no acute distress HEENT: moist mucus membranes, hearing grossly normal  Respiratory system: CTAB, no wheezes, rales or rhonchi, normal respiratory effort. Cardiovascular system:  normal S1/S2, RRR, no JVD, murmurs, rubs, gallops, no pedal  edema.   Gastrointestinal system: soft, non-distended, aquathermia pad over abdomen Central nervous system: A&O x 4. no gross focal neurologic deficits, normal speech Extremities: moves all, no edema, normal tone Skin: dry, intact, normal temperature Psychiatry: normal mood, congruent affect, judgement and insight appear normal      Diet Orders (From admission, onward)     Start     Ordered   01/29/24 0001  Diet NPO time specified Except for: Sips with Meds  Diet effective midnight       Comments: Can have sips of clear liquids (non-carbonated) up until 3 hours before surgery  Question:  Except for  Answer:  Sips with Meds   01/28/24 1430   01/28/24 0959  Diet full liquid Fluid consistency: Thin  Diet effective now       Question:  Fluid consistency:  Answer:  Thin   01/28/24 0958            Objective: Vitals:   01/28/24 0950 01/28/24 1000 01/28/24 1021 01/28/24 1335  BP: 120/72 119/62 111/74 122/79  Pulse: 89 80 79 81  Resp: (!) 22 18 20 20   Temp:   (!) 97.5 F (36.4 C) 98.4 F (36.9 C)  TempSrc:   Oral Oral  SpO2: 98% 95% 97% 98%  Weight:      Height:        Intake/Output Summary (Last 24 hours) at 01/28/2024 1543 Last data filed at 01/28/2024 1400 Gross per 24 hour  Intake 1737 ml  Output 0 ml  Net 1737 ml   Filed Weights   01/28/24 0827  Weight: 70.3 kg    Scheduled Meds:  polyethylene glycol  17 g Oral TID   Continuous Infusions:    Nutritional status     Body mass index is 23.57 kg/m.  Data Reviewed:   CBC: Recent Labs  Lab 01/26/24 0557 01/27/24 0412  WBC 8.6 13.7*  HGB 12.7 10.2*  HCT 40.7 34.0*  MCV 83.2 84.8  PLT 586* 468*   Basic Metabolic Panel: Recent Labs  Lab 01/26/24 0557 01/27/24 0412  NA 141 136  K 3.7 3.9  CL 106 106  CO2 23 20*  GLUCOSE 103* 112*  BUN 10 12  CREATININE 0.72 0.60  CALCIUM 9.2 8.4*   GFR: Estimated Creatinine Clearance: 86.8  mL/min (by C-G formula based on SCr of 0.6 mg/dL). Liver Function Tests: Recent Labs  Lab 01/26/24 0557 01/27/24 0412  AST 24 35  ALT <5 <5  ALKPHOS 53 44  BILITOT 0.4 0.5  PROT 7.0 5.8*  ALBUMIN 4.0 3.4*   Recent Labs  Lab 01/26/24 0557  LIPASE 14   No results for input(s): AMMONIA in the last 168 hours. Coagulation Profile: No results for input(s): INR, PROTIME in the last 168 hours. Cardiac Enzymes: No results for input(s): CKTOTAL, CKMB, CKMBINDEX, TROPONINI in the last 168 hours. BNP (last 3 results) No results for input(s): PROBNP in the last 8760 hours. HbA1C: No results for input(s): HGBA1C in the last 72 hours. CBG: No results for input(s): GLUCAP in the last 168 hours. Lipid Profile: No results for input(s): CHOL, HDL, LDLCALC, TRIG, CHOLHDL, LDLDIRECT in the last 72 hours. Thyroid  Function Tests: No results for input(s): TSH, T4TOTAL, FREET4, T3FREE, THYROIDAB in the last 72 hours. Anemia Panel: No results for input(s): VITAMINB12, FOLATE, FERRITIN, TIBC, IRON, RETICCTPCT in the last 72 hours. Sepsis Labs: No results for input(s): PROCALCITON, LATICACIDVEN in the last 168 hours.  No results found for this or any previous  visit (from the past 240 hours).       Radiology Studies: CT CHEST W CONTRAST Result Date: 01/27/2024 CLINICAL DATA:  Known pelvic mass. Indeterminate right middle lobe nodule on CT abdomen and pelvis. * Tracking Code: BO * EXAM: CT CHEST WITH CONTRAST TECHNIQUE: Multidetector CT imaging of the chest was performed during intravenous contrast administration. RADIATION DOSE REDUCTION: This exam was performed according to the departmental dose-optimization program which includes automated exposure control, adjustment of the mA and/or kV according to patient size and/or use of iterative reconstruction technique. CONTRAST:  75mL OMNIPAQUE IOHEXOL 300 MG/ML  SOLN COMPARISON:  CT abdomen and  pelvis dated 01/26/2024 FINDINGS: Cardiovascular: Normal heart size. No significant pericardial fluid/thickening. Great vessels are normal in course and caliber. No central pulmonary emboli. Mediastinum/Nodes: Imaged thyroid  gland without nodules meeting criteria for imaging follow-up by size. Normal esophagus. 11 mm right paratracheal lymphadenopathy (2:48). Lungs/Pleura: The central airways are patent. Bilateral lower lobe relaxation atelectasis. 2.9 x 2.5 cm rounded consolidation along the posterior superior segment right lower lobe (8:67). Scattered pulmonary nodules: -2 mm right apex (8:36) and left upper lobe (8:66) -9 x 5 mm left upper lobe perifissural (8:71) -7 x 4 mm right lower lobe peribronchovascular (8:73) -18 x 8 mm medial right middle lobe (8:93) No pneumothorax. New trace right and small left pleural effusions. Upper abdomen: Moderate volume ascites. Please see separately dictated recent CT abdomen and pelvis report for detailed findings. Musculoskeletal: No acute or abnormal lytic or blastic osseous lesions. IMPRESSION: 1. A 2.9 cm rounded consolidation along the posterior superior segment right lower lobe, suspicious for metastatic disease in the setting of known pelvic mass. Pneumonia can have a similar appearance if there are concurrent infectious symptoms. 2. Scattered pulmonary nodules measuring up to 9 mm in the left upper lobe, indeterminate, but suspicious for metastatic disease. 3. Right paratracheal lymphadenopathy, indeterminate. 4. New trace right and small left pleural effusions. 5. Moderate volume ascites. Electronically Signed   By: Limin  Xu M.D.   On: 01/27/2024 15:59           LOS: 1 day   Time spent= 35 mins    Burnard DELENA Cunning, DO [facility]  If 7PM-7AM, please contact night-coverage  01/28/2024, 3:43 PM

## 2024-01-28 NOTE — Anesthesia Postprocedure Evaluation (Signed)
 Anesthesia Post Note  Patient: Abigail Hicks  Procedure(s) Performed: COLONOSCOPY     Patient location during evaluation: PACU Anesthesia Type: MAC Level of consciousness: awake and alert Pain management: pain level controlled Vital Signs Assessment: post-procedure vital signs reviewed and stable Respiratory status: spontaneous breathing, nonlabored ventilation, respiratory function stable and patient connected to nasal cannula oxygen Cardiovascular status: stable and blood pressure returned to baseline Postop Assessment: no apparent nausea or vomiting Anesthetic complications: no   No notable events documented.  Last Vitals:  Vitals:   01/28/24 1000 01/28/24 1021  BP: 119/62 111/74  Pulse: 80 79  Resp: 18 20  Temp:  (!) 36.4 C  SpO2: 95% 97%    Last Pain:  Vitals:   01/28/24 1050  TempSrc:   PainSc: 8                  Thom JONELLE Peoples

## 2024-01-28 NOTE — Anesthesia Preprocedure Evaluation (Signed)
 Anesthesia Evaluation    Airway        Dental   Pulmonary asthma           Cardiovascular   HLD   Neuro/Psych    GI/Hepatic Colon mass   Endo/Other    Renal/GU      Musculoskeletal   Abdominal   Peds  Hematology  (+) Blood dyscrasia (thrombocytosis), anemia Lab Results      Component                Value               Date                      WBC                      13.7 (H)            01/27/2024                HGB                      10.2 (L)            01/27/2024                HCT                      34.0 (L)            01/27/2024                MCV                      84.8                01/27/2024                PLT                      468 (H)             01/27/2024              Anesthesia Other Findings   Reproductive/Obstetrics Pelvic mass                              Anesthesia Physical Anesthesia Plan  ASA: 2  Anesthesia Plan: General   Post-op Pain Management: Tylenol PO (pre-op)* and Ketamine IV*   Induction: Intravenous  PONV Risk Score and Plan: 3 and Ondansetron, Dexamethasone , Midazolam, Scopolamine patch - Pre-op and Treatment may vary due to age or medical condition  Airway Management Planned: Oral ETT  Additional Equipment:   Intra-op Plan:   Post-operative Plan: Extubation in OR  Informed Consent:   Plan Discussed with:   Anesthesia Plan Comments:         Anesthesia Quick Evaluation

## 2024-01-28 NOTE — Progress Notes (Signed)
 GYN Oncology Progress Note  HPI: 48 year old female currently admitted s/p colonoscopy today with a fungating, infiltrative and ulcerated partially obstructing large mass found in the recto-sigmoid colon. Biopsies were obtained with path pending. Recent CT imaging with findings suspicious for obstructing colonic mass, regional adenopathy, peritoneal carcinomatosis, bilateral ovarian masses, 2.9 cm rounded consolidation in RLL, scattered pulmonary nodules, new right and left pleural effusions.   Interval: Patient is currently undergoing an EKG due to chest pressure. The sensation of something sitting on her chest began 10 minutes ago. She has had shortness of breath related to the presence of the abdominal masses and reports no worsening in this.   Exam:    01/28/2024    1:35 PM 01/28/2024   10:21 AM 01/28/2024   10:00 AM  Vitals with BMI  Systolic 122 111 880  Diastolic 79 74 62  Pulse 81 79 80    CBC    Component Value Date/Time   WBC 13.7 (H) 01/27/2024 0412   RBC 4.01 01/27/2024 0412   HGB 10.2 (L) 01/27/2024 0412   HCT 34.0 (L) 01/27/2024 0412   PLT 468 (H) 01/27/2024 0412   MCV 84.8 01/27/2024 0412   MCH 25.4 (L) 01/27/2024 0412   MCHC 30.0 01/27/2024 0412   RDW 12.4 01/27/2024 0412   LYMPHSABS 1,630 04/09/2019 1040   MONOABS 496 02/23/2016 1524   EOSABS 63 04/09/2019 1040   BASOSABS 63 04/09/2019 1040   BMET    Component Value Date/Time   NA 136 01/27/2024 0412   K 3.9 01/27/2024 0412   CL 106 01/27/2024 0412   CO2 20 (L) 01/27/2024 0412   GLUCOSE 112 (H) 01/27/2024 0412   BUN 12 01/27/2024 0412   CREATININE 0.60 01/27/2024 0412   CREATININE 0.77 04/09/2019 1040   CALCIUM 8.4 (L) 01/27/2024 0412   GFRNONAA >60 01/27/2024 0412   GFRNONAA 94 04/09/2019 1040     Alert, oriented, resting in bed, in no acute distress, conversing normally. Lungs clear. Heart regular in rate and rhythm. Currently undergoing EKG. Abdomen is distended. Tender on palpation. Active  bowel sounds.   A/P: Discussed with patient we are waiting on the biopsy results from colonoscopy for next steps. Per Dr. Viktoria, plan to keep patient on the OR schedule for now in case diagnostic laparoscopy with possible biopsies is needed. High chance surgery will be cancelled if diagnosis obtained from biopsies. Patient verbalizes understanding.

## 2024-01-28 NOTE — Interval H&P Note (Signed)
 History and Physical Interval Note:  01/28/2024 8:35 AM  Abigail Hicks  has presented today for surgery, with the diagnosis of Evaluate for malignancy.  The various methods of treatment have been discussed with the patient and family. After consideration of risks, benefits and other options for treatment, the patient has consented to  Procedure(s): EGD (ESOPHAGOGASTRODUODENOSCOPY) (N/A) COLONOSCOPY (N/A) as a surgical intervention.  The patient's history has been reviewed, patient examined, no change in status, stable for surgery.  I have reviewed the patient's chart and labs.  Questions were answered to the patient's satisfaction.     [name]

## 2024-01-29 ENCOUNTER — Encounter (HOSPITAL_COMMUNITY): Payer: Self-pay | Admitting: Anesthesiology

## 2024-01-29 ENCOUNTER — Encounter (HOSPITAL_COMMUNITY): Payer: Self-pay | Admitting: Internal Medicine

## 2024-01-29 ENCOUNTER — Inpatient Hospital Stay (HOSPITAL_COMMUNITY): Admission: RE | Admit: 2024-01-29 | Source: Home / Self Care | Admitting: Gynecologic Oncology

## 2024-01-29 ENCOUNTER — Encounter: Payer: Self-pay | Admitting: Oncology

## 2024-01-29 ENCOUNTER — Encounter (HOSPITAL_COMMUNITY)
Admission: EM | Disposition: A | Discharge status: Home / Self Care | Payer: Self-pay | Source: Home / Self Care | Attending: Internal Medicine

## 2024-01-29 DIAGNOSIS — N9489 Other specified conditions associated with female genital organs and menstrual cycle: Secondary | ICD-10-CM

## 2024-01-29 DIAGNOSIS — K6389 Other specified diseases of intestine: Secondary | ICD-10-CM | POA: Diagnosis not present

## 2024-01-29 DIAGNOSIS — R970 Elevated carcinoembryonic antigen [CEA]: Secondary | ICD-10-CM

## 2024-01-29 DIAGNOSIS — R978 Other abnormal tumor markers: Secondary | ICD-10-CM

## 2024-01-29 LAB — CBC
HCT: 28.7 % — ABNORMAL LOW (ref 36.0–46.0)
Hemoglobin: 9.1 g/dL — ABNORMAL LOW (ref 12.0–15.0)
MCH: 26.4 pg (ref 26.0–34.0)
MCHC: 31.7 g/dL (ref 30.0–36.0)
MCV: 83.2 fL (ref 80.0–100.0)
Platelets: 411 K/uL — ABNORMAL HIGH (ref 150–400)
RBC: 3.45 MIL/uL — ABNORMAL LOW (ref 3.87–5.11)
RDW: 12.3 % (ref 11.5–15.5)
WBC: 5.9 K/uL (ref 4.0–10.5)
nRBC: 0 % (ref 0.0–0.2)

## 2024-01-29 SURGERY — LAPAROSCOPY, DIAGNOSTIC
Anesthesia: General

## 2024-01-29 MED ORDER — SCOPOLAMINE 1 MG/3DAYS TD PT72: 1.0000 | MEDICATED_PATCH | TRANSDERMAL | Status: DC

## 2024-01-29 MED ORDER — ACETAMINOPHEN 500 MG PO TABS: 1000.0000 mg | ORAL_TABLET | ORAL | Status: DC

## 2024-01-29 MED ORDER — DEXAMETHASONE SOD PHOSPHATE PF 10 MG/ML IJ SOLN: 4.0000 mg | INTRAMUSCULAR | Status: DC

## 2024-01-29 MED ORDER — HEPARIN SODIUM (PORCINE) 5000 UNIT/ML IJ SOLN: 5000.0000 [IU] | INTRAMUSCULAR | Status: DC

## 2024-01-29 MED ORDER — POLYETHYLENE GLYCOL 3350 17 G PO PACK
17.0000 g | PACK | Freq: Two times a day (BID) | ORAL | Status: DC
  Administered 2024-01-30 – 2024-01-31 (×3): 17 g via ORAL
  Filled 2024-01-29 (×4): qty 1

## 2024-01-29 MED ORDER — POVIDONE-IODINE 10 % EX SWAB: 2.0000 | Freq: Once | CUTANEOUS | Status: DC

## 2024-01-29 NOTE — Consult Note (Signed)
 Abigail Hicks [birth year]  [id number].    Requesting MD: Dr. Comer Dollar Chief Complaint/Reason for Consult: Colon Mass  HPI: Abigail Hicks is a 48 y.o. female with a history of asthma and hyperlipidemia who we are asked to see regarding colon mass noted on colonoscopy and imaging. Patient reports she has had constipation for the last 6 weeks that she thought was from starting iron supplementation.  She was still able to have soft bowel movements daily but these were smaller and more narrow in caliber.  She denies any known hematochezia or melena.  She also has been having some lower abdominal pain.  She is still tolerating p.o. but not eating as much.  No reported unintentional weight loss.  She reports family history of colon cancer in paternal grandfather.  Her father had multiple myeloma.  She denies personal history of cancer.  She has never had a colonoscopy prior to admission.  She reports she was seen by her PCP for this who ordered a CT scan for further evaluation that showed a Large abdominopelvic cystic mass with ascites and right hydroureteronephrosis to the level of the pelvic mass.  She was seen by GYN oncology with plans to go to the OR for diagnostic laparoscopy today.  However patient's CEA level came back elevated so urgent referral for colonoscopy was placed.  Patient was admitted underwent CT scan imaging that showed irregular thickening and luminal narrowing suspicious for an underlying obstructing colon mass of the distal sigmoid colon as well as possible peritoneal carcinomatosis, malignant ascites, bilateral ovarian masses, scattered pulmonary nodules and right peritracheal lymphadenopathy.  She went colonoscopy yesterday that showed circumferential partially obstructing large mass in the rectosigmoid colon. Per discussion with GYN ONC prelim biopsies show adenoca, favor colon primary and IHC will take another 1-2 days. CEA 15.51, CA 125 64.3, AFP wnl.   Patient  reports she is tolerating p.o. without nausea or vomiting.  She continues to have bowel function with last BM yesterday after colonoscopy.  She reports she is not on blood thinners.  Denies prior abdominal surgeries.  She denies tobacco use. Her husband is at bedside during today's visit.   ROS: ROS As above, see hpi  Family History  Problem Relation Age of Onset   Arthritis Mother        Rhumatoid   Multiple myeloma Father 65   Breast cancer Sister    Lupus Maternal Grandmother    Heart attack Maternal Grandfather    Colon cancer Maternal Grandfather    Heart attack Paternal Grandfather    Heart attack Maternal Uncle 88   Stroke Maternal Uncle    Ovarian cancer Neg Hx    Endometrial cancer Neg Hx    Pancreatic cancer Neg Hx    Prostate cancer Neg Hx     Past Medical History:  Diagnosis Date   Asthma 04/09/2019   no medications since 20s   Mixed hyperlipidemia 01/26/2015   Vitamin D  deficiency     Past Surgical History:  Procedure Laterality Date   COLONOSCOPY N/A 01/28/2024   Procedure: COLONOSCOPY;  Surgeon: Mansouraty, Aloha Raddle., MD;  Location: [facility];  Service: Gastroenterology;  Laterality: N/A;   NO PAST SURGERIES      Social History:  reports that she has never smoked. She has never used smokeless tobacco. She reports that she does not currently use alcohol. She reports that she does not use drugs.  Allergies:  Allergies  Allergen Reactions   Pneumovax [Pneumococcal  Polysaccharide Vaccine] Other (See Comments)   Sulfamethoxazole-Trimethoprim Rash    Unsure of reaction, childhood allergy     Medications Prior to Admission  Medication Sig Dispense Refill   Multiple Vitamin (MULTIVITAMIN) tablet Take 1 tablet by mouth daily.     senna-docusate (SENOKOT-S) 8.6-50 MG tablet Take 1-2 tablets by mouth at bedtime. Do not take if having diarrhea. Can start with one tablet initially. 60 tablet 3   traMADol (ULTRAM) 50 MG tablet Take 1 tablet (50 mg total) by  mouth every 6 (six) hours as needed for moderate pain (pain score 4-6). For AFTER surgery only, do not take and drive 10 tablet 0     Physical Exam: Blood pressure 128/84, pulse 85, temperature 98.3 F (36.8 C), temperature source Oral, resp. rate 16, height 5' 8 (1.727 m), weight 70.3 kg, SpO2 98%. General: pleasant, WD/WN female who is laying in bed in NAD HEENT: head is normocephalic, atraumatic.  Sclera are non-icteric.  Heart: regular, rate, and rhythm.   Lungs: Respiratory effort nonlabored Abd:  Soft, mild distension, mild lower abdominal ttp without rigidity or guarding. No palpable masses or organomegaly. Small umbilical hernia without complicating features.  MS: no BUE edema Skin: warm and dry  Psych: A&Ox4 with an appropriate affect Neuro: normal speech, thought process intact, gait not assessed  Results for orders placed or performed during the hospital encounter of 01/26/24 (from the past 48 hours)  CBC     Status: Abnormal   Collection Time: 01/29/24  5:09 AM  Result Value Ref Range   WBC 5.9 4.0 - 10.5 K/uL   RBC 3.45 (L) 3.87 - 5.11 MIL/uL   Hemoglobin 9.1 (L) 12.0 - 15.0 g/dL   HCT 71.2 (L) 63.9 - 53.9 %   MCV 83.2 80.0 - 100.0 fL   MCH 26.4 26.0 - 34.0 pg   MCHC 31.7 30.0 - 36.0 g/dL   RDW 87.6 88.4 - 84.4 %   Platelets 411 (H) 150 - 400 K/uL   nRBC 0.0 0.0 - 0.2 %    Comment: Performed at [facility], [address], [place], [place] [address]   No results found.  Anti-infectives (From admission, onward)    None       Assessment/Plan Colon Mass - CT A/P with irregular mural thickening and luminal narrowing suspicious for an underlying obstructing colonic mass of the distal sigmoid colon.  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 Bilateral  ovarian masses are seen. 2.9 cm rounded consolidation along the posterior superior segment right lower lobe. Scattered pulmonary nodules measuring up to 9 mm in the left upper lobe and Right paratracheal lymphadenopathy.  - Colonoscopy 10/21 circumferential partially obstructing large mass was found in the recto- sigmoid colon, in the sigmoid colon, at 14 cm proximal to the anus and at 20 cm proximal to the anus measuring six cm in length. Area was tattooed. To note, during colonoscopy the entire examined portion of the colon was grossly redundant leading to looping as an effect of the lesion described above causing significant dilation of upstream colon.  - Path pending. Per discussion with GYN ONC prelim biopsies show adenoca, favor colon primary and IHC will take another 1-2 days.  - CEA 15.51, CA 125 64.3, AFP wnl.  - Denies melena or hematochezia.  Oozing of the mass was present at the time  of the colonoscopy. Hemoglobin 12.7 --> 10.2 --> 9.1. HDS. She is not on blood thinners. Monitor.  - Patient reports she is tolerating po and continues to have bowel function.  She is not completely obstructed on colonoscopy.  I reviewed the case with my attending and we currently do not recommend surgery, a diverting ostomy or colonic stent. - Patient is okay to start a FLD diet from our standpoint.  Could benefit from staying on daily or twice daily Miralax - Dr. Lanny of Oncology is already following. [facility] has ordered for IR Port placement.  - Will continue to follow hgb to ensure this stabilizes and does not continue to drift. If patient develops obstructive symptoms please notify our team.   FEN - FLD, Miralax, IVF per [facility] VTE - SCDs, okay for chem ppx from a general surgery standpoint ID - None currently.   I reviewed nursing notes, Consultant (GI, GYN Oncology) notes, hospitalist notes, last 24 h vitals and pain scores, last 48 h intake and output, last 24 h labs and trends, and last 24 h imaging  results.   Ozell CHRISTELLA Shaper, [facility] Surgery 01/29/2024, 11:31 AM Please see Amion for pager number during day hours 7:00am-4:30pm

## 2024-01-29 NOTE — Progress Notes (Addendum)
 GYN Oncology Progress Note  Patient is currently in short stay.  Reports having continued nausea with no emesis.  Abdominal pain is currently at a level 8.  She does feel like the pain medications help some.  No recent bowel movement after completing bowel prep yesterday.  Discussed with patient we are still waiting for biopsy results.  Our office is going to reach out to pathology.  Advised patient and RN in the room there is still a chance that surgery will not be necessary today if the biopsies from the colonoscopy yesterday are diagnostic.  Patient verbalizing understanding.     Addendum I met with the patient at approximately 1130.  I received a call from pathology confirming that colonoscopy biopsy showed adenocarcinoma, favored to be colon primary.  Final diagnosis is pending IHC results.  Discussed with the patient and her husband reason for no surgery today (surgery with me was going to be for diagnostic laparoscopy and tissue biopsy if colonoscopy did not give us  a diagnosis).  Patient has been seen by Dr. Lanny.  I also reached out to general surgery (previously talked to Dr. Teresa earlier this week about the patient's case) waiting on the decision regarding watchful waiting, stent placement with GI, versus diverting ostomy in the setting of her partially obstructive symptoms. Comer Dollar MD Gynecologic Oncology

## 2024-01-29 NOTE — Progress Notes (Signed)
 Abigail Hicks   DOB:October 09, 1975   FM#:[id number]   RDW#:[id number]  Med/onc follow up   Subjective: Patient had colonoscopy yesterday, which showed large fungating mass in the rectosigmoid colon, biopsy preliminary is consistent with colorectal cancer.  Patient and her husband are anxious to know what to do next.  She did have a very loose/watery bowel movement today.  No nausea or vomiting.  She is only on a clear liquid now.   Objective:  Vitals:   01/29/24 1321 01/29/24 1644  BP: 121/76 127/79  Pulse: 88 97  Resp: 16 16  Temp: 98.4 F (36.9 C) 98.2 F (36.8 C)  SpO2: 97% 96%    Body mass index is 23.57 kg/m.  Intake/Output Summary (Last 24 hours) at 01/29/2024 1802 Last data filed at 01/29/2024 1700 Gross per 24 hour  Intake 720 ml  Output --  Net 720 ml     Sclerae unicteric  Oropharynx clear  No peripheral adenopathy  Lungs clear -- no rales or rhonchi  Heart regular rate and rhythm  Abdomen soft  MSK no focal spinal tenderness, no peripheral edema  Neuro nonfocal    CBG (last 3)  No results for input(s): GLUCAP in the last 72 hours.   Labs:  Lab Results  Component Value Date   WBC 5.9 01/29/2024   HGB 9.1 (L) 01/29/2024   HCT 28.7 (L) 01/29/2024   MCV 83.2 01/29/2024   PLT 411 (H) 01/29/2024   NEUTROABS 3,443 04/09/2019     Urine Studies No results for input(s): UHGB, CRYS in the last 72 hours.  Invalid input(s): UACOL, UAPR, USPG, UPH, UTP, UGL, UKET, UBIL, UNIT, UROB, ULEU, UEPI, UWBC, URBC, UBAC, CAST, UCOM, BILUA  Basic Metabolic Panel: Recent Labs  Lab 01/26/24 0557 01/27/24 0412  NA 141 136  K 3.7 3.9  CL 106 106  CO2 23 20*  GLUCOSE 103* 112*  BUN 10 12  CREATININE 0.72 0.60  CALCIUM 9.2 8.4*   GFR Estimated Creatinine Clearance: 86.8 mL/min (by C-G formula based on SCr of 0.6 mg/dL). Liver Function Tests: Recent Labs  Lab 01/26/24 0557 01/27/24 0412  AST 24 35  ALT <5 <5  ALKPHOS  53 44  BILITOT 0.4 0.5  PROT 7.0 5.8*  ALBUMIN 4.0 3.4*   Recent Labs  Lab 01/26/24 0557  LIPASE 14   No results for input(s): AMMONIA in the last 168 hours. Coagulation profile No results for input(s): INR, PROTIME in the last 168 hours.  CBC: Recent Labs  Lab 01/26/24 0557 01/27/24 0412 01/29/24 0509  WBC 8.6 13.7* 5.9  HGB 12.7 10.2* 9.1*  HCT 40.7 34.0* 28.7*  MCV 83.2 84.8 83.2  PLT 586* 468* 411*   Cardiac Enzymes: No results for input(s): CKTOTAL, CKMB, CKMBINDEX, TROPONINI in the last 168 hours. BNP: Invalid input(s): POCBNP CBG: No results for input(s): GLUCAP in the last 168 hours. D-Dimer No results for input(s): DDIMER in the last 72 hours. Hgb A1c No results for input(s): HGBA1C in the last 72 hours. Lipid Profile No results for input(s): CHOL, HDL, LDLCALC, TRIG, CHOLHDL, LDLDIRECT in the last 72 hours. Thyroid  function studies No results for input(s): TSH, T4TOTAL, T3FREE, THYROIDAB in the last 72 hours.  Invalid input(s): FREET3 Anemia work up No results for input(s): VITAMINB12, FOLATE, FERRITIN, TIBC, IRON, RETICCTPCT in the last 72 hours. Microbiology No results found for this or any previous visit (from the past 240 hours).    Studies:  No results found.  Assessment: 48 y.o. 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  The scope was able to pass, but she is at risk for bowel obstruction.  Patient has been evaluated by general surgery and GI, diverting ostomy is not needed at this point, we discussed option of chronic stenting at the tumor site, she wants to hold it for now. - We reviewed her CT scan  images together, which unfortunately showed high disease burden in the peritoneum and metastatic cancer in her lungs also.  Unfortunately disease not curable disease, the goal of treatment is palliative to prolong her life. - Will request MMR to see if she is a candidate for immunotherapy.  I will also request NGS [facility] on her biopsy sample to see if she is a candidate for targeted therapy. - I recommend first-line chemotherapy FOLFIRINOX for rapid response due to her high disease burden in the peritoneum, and pending bowel obstruction from the primary tumor.  Potential benefit and side effect discussed with patient and her husband, her husband had many questions, particularly neuropathy, I answered to them all. - Will request IR port placement tomorrow - We also discussed diet, I recommend low residual diet, with restriction on vegetables and fruits, and encouraged her to take liquid nutrition.  Will ask dietitian to see her in hospital and follow-up her in our office. - Will schedule her chemo and chemo class, hopefully start next week. - I spent a total of 60 minutes for her visit today.   Abigail Mattock, MD 01/29/2024  6:02 PM

## 2024-01-29 NOTE — Plan of Care (Signed)

## 2024-01-29 NOTE — Progress Notes (Cosign Needed)
     [facility] Progress Note  CC:  Colonic mass and stricture   Subjective:  Feels ok.  Has abdominal pain, waiting on pain medication.  Passing stool and flatus.  No sign of rectal bleeding.  Objective:  Vital signs in last 24 hours: Temp:  [98 F (36.7 C)-98.4 F (36.9 C)] 98.3 F (36.8 C) (10/22 0819) Pulse Rate:  [81-85] 85 (10/22 0819) Resp:  [16-20] 16 (10/22 0819) BP: (113-128)/(75-84) 128/84 (10/22 0819) SpO2:  [95 %-98 %] 98 % (10/22 0819) Last BM Date : 01/28/24 General:  Alert, Well-developed, in NAD Heart:  Regular rate and rhythm; no murmurs Pulm:  CTAB.  No W/R/R. Abdomen:  Softly distended.  BS present.  TTP in the LLQ. Extremities:  Without edema. Neurologic:  Alert and oriented x 4;  grossly normal neurologically. Psych:  Alert and cooperative. Normal mood and affect.  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  Also has findings consistent with peritoneal carcinomatosis and ascites.  Sigmoid colon tumor positive for adenocarcinoma on pathology per the notes, but Dr. Wilhelmenia has not gotten final path back yet.   Leukocytosis, resolved   -Question of possible enteral stent placement later this week (possibly tomorrow) per Dr. Wilhelmenia.  He will have further discussion with the patient.    LOS: 2 days   Harlene BIRCH. Zehr  01/29/2024, 12:21 PM

## 2024-01-29 NOTE — Progress Notes (Signed)
 Spoke with Dr. Delaine with [facility] regarding accession [id number].  Dr. Delaine said the biopsy is positive for cancer - adenocarcinoma.  Notified Dr. Viktoria of the findings.

## 2024-01-29 NOTE — Progress Notes (Signed)
 PROGRESS NOTE    Abigail Hicks  FMW:[id number] DOB: 09-19-1975 DOA: 01/26/2024 PCP: [name]   Brief Narrative:  48 y.o. female with medical history significant of acute urinary retention episode, asthma, vitamin D  deficiency who presented to the emergency department complaints of abdominal pain constipation.  Patient previously noted bilateral ovarian masses initially planned for further evaluation and treatment with gynecology oncology with Dr. 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  Patient had notable stenosis of the colon as well but is not obstructed.  At this time patient is being evaluated by GI, general surgery, oncology for further evaluation and treatment.  Current plan is to advance diet with full liquids and MiraLAX and evaluate for tolerance, patient is hoping to avoid ostomy and at this time it does not appear that a stent or colonoscopy would be of any particular value at this time.  There is also a notable 10 mm pulmonary nodule concerning for distant mets which would make this stage IV if metabolically active on PET.  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 - Biopsy, preliminary, confirms adenocarcinoma - Further testing and imaging pending per consults as above. - Initially thought to have obstructive pathology due to size of mass, at this time has been able to pass liquid stool with support and no  clear signs of obstruction.  Thrombocytosis: Questionably reactive in the setting of underlying malignancy Vitamin D  deficiency: Continue with Vitamin D  supplementation Asthma: No acute issues -currently on room air  DVT prophylaxis: SCDs Start: 01/26/24 1652 Code Status:   Code Status: Full Code Family Communication: Husband at bedside  Status is: Inpatient  Dispo: The patient is from: Home              Anticipated d/c is to: Home              Anticipated d/c date is: 24-48h              Patient currently not medically stable for discharge  Consultants:  GI, GynOnc, Oncology, General Surgery  Procedures:  Colonoscopy 01/28/24  Antimicrobials:  None   Subjective: No acute issues or events overnight denies nausea vomiting headache fever chills chest pain, she does endorse ongoing abdominal pain worse with exertion or movement.  Objective: Vitals:   01/28/24 1021 01/28/24 1335 01/28/24 2223 01/29/24 0656  BP: 111/74 122/79 113/75 126/77  Pulse: 79 81 81 84  Resp: 20 20 18 18   Temp: (!) 97.5 F (36.4 C) 98.4 F (36.9 C) 98 F (36.7 C) 98 F (36.7 C)  TempSrc: Oral Oral Oral Oral  SpO2: 97% 98% 96% 95%  Weight:      Height:        Intake/Output Summary (Last 24 hours) at 01/29/2024 0713 Last data filed at 01/28/2024 2200 Gross per 24 hour  Intake 1020 ml  Output 0 ml  Net 1020 ml   Filed Weights   01/28/24 0827  Weight: 70.3 kg  Examination:  General:  Pleasantly resting in bed, No acute distress. HEENT:  Normocephalic atraumatic.  Sclerae nonicteric, noninjected.  Extraocular movements intact bilaterally. Neck:  Without mass or deformity.  Trachea is midline. Lungs:  Clear to auscultate bilaterally without rhonchi, wheeze, or rales. Heart:  Regular rate and rhythm.  Without murmurs, rubs, or gallops. Abdomen:  Soft, moderately tender at the pubis. Without guarding or rebound. Extremities: Without cyanosis, clubbing, edema, or obvious deformity. Skin:   Warm and dry, no erythema.  Data Reviewed: I have personally reviewed following labs and imaging studies  CBC: Recent Labs  Lab 01/26/24 0557 01/27/24 0412 01/29/24 0509  WBC 8.6 13.7* 5.9  HGB 12.7 10.2* 9.1*  HCT 40.7 34.0* 28.7*  MCV 83.2 84.8 83.2  PLT 586* 468* 411*   Basic Metabolic Panel: Recent Labs  Lab 01/26/24 0557 01/27/24 0412  NA 141 136  K 3.7 3.9  CL 106 106  CO2 23 20*  GLUCOSE 103* 112*  BUN 10 12  CREATININE 0.72 0.60  CALCIUM 9.2 8.4*   GFR: Estimated Creatinine Clearance: 86.8 mL/min (by C-G formula based on SCr of 0.6 mg/dL). Liver Function Tests: Recent Labs  Lab 01/26/24 0557 01/27/24 0412  AST 24 35  ALT <5 <5  ALKPHOS 53 44  BILITOT 0.4 0.5  PROT 7.0 5.8*  ALBUMIN 4.0 3.4*   Recent Labs  Lab 01/26/24 0557  LIPASE 14   No results for input(s): AMMONIA in the last 168 hours. Coagulation Profile: No results for input(s): INR, PROTIME in the last 168 hours. Cardiac Enzymes: No results for input(s): CKTOTAL, CKMB, CKMBINDEX, TROPONINI in the last 168 hours. BNP (last 3 results) No results for input(s): PROBNP in the last 8760 hours. HbA1C: No results for input(s): HGBA1C in the last 72 hours. CBG: No results for input(s): GLUCAP in the last 168 hours. Lipid Profile: No results for input(s): CHOL, HDL, LDLCALC, TRIG, CHOLHDL, LDLDIRECT in the last 72 hours. Thyroid  Function Tests: No results for input(s): TSH, T4TOTAL, FREET4, T3FREE, THYROIDAB in the last 72 hours. Anemia Panel: No results for input(s): VITAMINB12, FOLATE, FERRITIN, TIBC, IRON, RETICCTPCT in the last 72 hours. Sepsis Labs: No results for input(s): PROCALCITON, LATICACIDVEN in the last 168 hours.  No results found for this or any previous visit (from the past 240 hours).       Radiology Studies: CT CHEST W CONTRAST Result Date: 01/27/2024 CLINICAL DATA:  Known pelvic mass. Indeterminate right  middle lobe nodule on CT abdomen and pelvis. * Tracking Code: BO * EXAM: CT CHEST WITH CONTRAST TECHNIQUE: Multidetector CT imaging of the chest was performed during intravenous contrast administration. RADIATION DOSE REDUCTION: This exam was performed according to the departmental dose-optimization program which includes automated exposure control, adjustment of the mA and/or kV according to patient size and/or use of iterative reconstruction technique. CONTRAST:  75mL OMNIPAQUE IOHEXOL 300 MG/ML  SOLN COMPARISON:  CT abdomen and pelvis dated 01/26/2024 FINDINGS: Cardiovascular: Normal heart size. No significant pericardial fluid/thickening. Great vessels are normal in course and caliber. No central pulmonary emboli. Mediastinum/Nodes: Imaged thyroid  gland without nodules meeting criteria for imaging follow-up by size. Normal esophagus. 11 mm right paratracheal lymphadenopathy (2:48). Lungs/Pleura: The central airways are patent. Bilateral lower lobe relaxation atelectasis. 2.9 x 2.5 cm rounded consolidation along the posterior superior segment right lower lobe (8:67). Scattered pulmonary nodules: -2 mm right apex (8:36) and left upper lobe (8:66) -9 x 5 mm left upper lobe perifissural (8:71) -7 x 4 mm right lower lobe  peribronchovascular (8:73) -18 x 8 mm medial right middle lobe (8:93) No pneumothorax. New trace right and small left pleural effusions. Upper abdomen: Moderate volume ascites. Please see separately dictated recent CT abdomen and pelvis report for detailed findings. Musculoskeletal: No acute or abnormal lytic or blastic osseous lesions. IMPRESSION: 1. A 2.9 cm rounded consolidation along the posterior superior segment right lower lobe, suspicious for metastatic disease in the setting of known pelvic mass. Pneumonia can have a similar appearance if there are concurrent infectious symptoms. 2. Scattered pulmonary nodules measuring up to 9 mm in the left upper lobe, indeterminate, but suspicious for  metastatic disease. 3. Right paratracheal lymphadenopathy, indeterminate. 4. New trace right and small left pleural effusions. 5. Moderate volume ascites. Electronically Signed   By: Limin  Xu M.D.   On: 01/27/2024 15:59   Scheduled Meds:  polyethylene glycol  17 g Oral TID   Continuous Infusions:   LOS: 2 days   Time spent:  Elsie JAYSON Montclair, DO [facility]  If 7PM-7AM, please contact night-coverage [email or web address]  01/29/2024, 7:13 AM

## 2024-01-30 DIAGNOSIS — R1900 Intra-abdominal and pelvic swelling, mass and lump, unspecified site: Secondary | ICD-10-CM | POA: Diagnosis not present

## 2024-01-30 DIAGNOSIS — R933 Abnormal findings on diagnostic imaging of other parts of digestive tract: Secondary | ICD-10-CM

## 2024-01-30 DIAGNOSIS — K6389 Other specified diseases of intestine: Secondary | ICD-10-CM | POA: Diagnosis not present

## 2024-01-30 DIAGNOSIS — R1084 Generalized abdominal pain: Secondary | ICD-10-CM | POA: Diagnosis not present

## 2024-01-30 DIAGNOSIS — K5902 Outlet dysfunction constipation: Secondary | ICD-10-CM | POA: Diagnosis not present

## 2024-01-30 DIAGNOSIS — R978 Other abnormal tumor markers: Secondary | ICD-10-CM

## 2024-01-30 LAB — CBC
HCT: 31 % — ABNORMAL LOW (ref 36.0–46.0)
Hemoglobin: 9.5 g/dL — ABNORMAL LOW (ref 12.0–15.0)
MCH: 25.1 pg — ABNORMAL LOW (ref 26.0–34.0)
MCHC: 30.6 g/dL (ref 30.0–36.0)
MCV: 81.8 fL (ref 80.0–100.0)
Platelets: 472 K/uL — ABNORMAL HIGH (ref 150–400)
RBC: 3.79 MIL/uL — ABNORMAL LOW (ref 3.87–5.11)
RDW: 12.1 % (ref 11.5–15.5)
WBC: 5.4 K/uL (ref 4.0–10.5)
nRBC: 0 % (ref 0.0–0.2)

## 2024-01-30 LAB — IRON AND TIBC
Iron: 14 ug/dL — ABNORMAL LOW (ref 28–170)
Saturation Ratios: 6 % — ABNORMAL LOW (ref 10.4–31.8)
TIBC: 248 ug/dL — ABNORMAL LOW (ref 250–450)
UIBC: 233 ug/dL

## 2024-01-30 LAB — CYTOLOGY - PAP
Comment: NEGATIVE
Diagnosis: REACTIVE
High risk HPV: NEGATIVE

## 2024-01-30 LAB — CYTOLOGY - NON PAP

## 2024-01-30 LAB — FERRITIN: Ferritin: 107 ng/mL (ref 11–307)

## 2024-01-30 MED ORDER — ENSURE PLUS HIGH PROTEIN PO LIQD
237.0000 mL | Freq: Three times a day (TID) | ORAL | Status: DC
  Administered 2024-01-30: 237 mL via ORAL

## 2024-01-30 NOTE — Progress Notes (Signed)
 PROGRESS NOTE    Abigail Hicks  FMW:[id number] DOB: [birth year] DOA: 01/26/2024 PCP: Abigail Hicks   Brief Narrative:  48 y.o. female with medical history significant of acute urinary retention episode, asthma, vitamin D  deficiency who presented to the emergency department complaints of abdominal pain constipation.  Patient previously noted bilateral ovarian masses initially planned for further evaluation and treatment with gynecology oncology with Dr. 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  Patient had notable stenosis of the colon as well but is not obstructed.  At this time patient is being evaluated by GI, general surgery, oncology for further evaluation and treatment.  Current plan is to advance diet with full liquids and MiraLAX and evaluate for tolerance, patient is hoping to avoid ostomy and at this time it does not appear that a stent or colonoscopy would be of any particular value at this time.  There is also a notable 10 mm pulmonary nodule concerning for distant mets which would make this stage IV if metabolically active on PET.  Patient admitted as above with presumed colonic obstruction and metastatic adenocarcinoma as well as bilateral ovarian masses and lung nodule concerning for advanced disease.  At this time patient's GI symptoms have improved on liquid diet, lengthy discussion with oncology, GI, general surgery, GynOnc in regards to take steps.  Given patient's resolution of GI symptoms there is no indication for urgent or emergent general surgery evaluation or GI intervention via endoscopy and stenting.  At this time patient's plan is to transition to palliative chemo with hopes of improved lesion size for future surgical intervention.  Currently awaiting port placement, otherwise she appears to be improving on current supportive measures.  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 - Biopsy, preliminary, confirms adenocarcinoma - Further testing and imaging pending per consults as above. - Initially thought to have obstructive pathology due to size of mass, at this time has been able to pass liquid stool with support and no clear signs of obstruction.  Thrombocytosis: Questionably reactive in the setting of underlying malignancy Vitamin D  deficiency: Continue with Vitamin D  supplementation Asthma: No acute issues -currently on room air  DVT prophylaxis: SCDs Start: 01/26/24 1652 Code Status:   Code Status: Full Code Family Communication: Husband at bedside  Status is: Inpatient  Dispo: The patient is from: Home              Anticipated d/c is to: Home              Anticipated d/c date is: 24-48h              Patient currently not medically stable for discharge  Consultants:  GI, GynOnc, Oncology, General Surgery  Procedures:  Colonoscopy 01/28/24  Antimicrobials:  None   Subjective: No acute issues or events overnight denies nausea vomiting headache fever chills chest pain, previous  abdominal pain appears to be resolving  Objective: Vitals:   01/29/24 1644 01/29/24 2047 01/30/24 0230 01/30/24 0612  BP: 127/79 119/76 126/82 127/77  Pulse: 97 81 83 85  Resp: 16 17  17   Temp: 98.2 F (36.8 C) 98.5 F (36.9 C) 98.4 F (36.9 C) 98.7 F (37.1 C)  TempSrc:  Oral Oral Oral  SpO2: 96% 95% 93% 94%   Weight:      Height:        Intake/Output Summary (Last 24 hours) at 01/30/2024 0727 Last data filed at 01/30/2024 0600 Gross per 24 hour  Intake 960 ml  Output --  Net 960 ml   Filed Weights   01/28/24 0827  Weight: 70.3 kg    Examination:  General:  Pleasantly resting in bed, No acute distress. HEENT:  Normocephalic atraumatic.  Sclerae nonicteric, noninjected.  Extraocular movements intact bilaterally. Neck:  Without mass or deformity.  Trachea is midline. Lungs:  Clear to auscultate bilaterally without rhonchi, wheeze, or rales. Heart:  Regular rate and rhythm.  Without murmurs, rubs, or gallops. Abdomen:  Soft, moderately tender at the pubis. Without guarding or rebound. Extremities: Without cyanosis, clubbing, edema, or obvious deformity. Skin:  Warm and dry, no erythema.  Data Reviewed: I have personally reviewed following labs and imaging studies  CBC: Recent Labs  Lab 01/26/24 0557 01/27/24 0412 01/29/24 0509 01/30/24 0454  WBC 8.6 13.7* 5.9 5.4  HGB 12.7 10.2* 9.1* 9.5*  HCT 40.7 34.0* 28.7* 31.0*  MCV 83.2 84.8 83.2 81.8  PLT 586* 468* 411* 472*   Basic Metabolic Panel: Recent Labs  Lab 01/26/24 0557 01/27/24 0412  NA 141 136  K 3.7 3.9  CL 106 106  CO2 23 20*  GLUCOSE 103* 112*  BUN 10 12  CREATININE 0.72 0.60  CALCIUM 9.2 8.4*   GFR: Estimated Creatinine Clearance: 86.8 mL/min (by C-G formula based on SCr of 0.6 mg/dL). Liver Function Tests: Recent Labs  Lab 01/26/24 0557 01/27/24 0412  AST 24 35  ALT <5 <5  ALKPHOS 53 44  BILITOT 0.4 0.5  PROT 7.0 5.8*  ALBUMIN 4.0 3.4*   Recent Labs  Lab 01/26/24 0557  LIPASE 14   Anemia Panel: Recent Labs    01/30/24 0454  FERRITIN 107  TIBC 248*  IRON 14*   Sepsis Labs: No results for input(s): PROCALCITON, LATICACIDVEN in the last 168 hours.  No results found for this or any previous visit (from the past 240 hours).       Radiology Studies: No results  found.  Scheduled Meds:  polyethylene glycol  17 g Oral BID   Continuous Infusions:   LOS: 3 days   Time spent:  Abigail JAYSON Montclair, DO [facility]  If 7PM-7AM, please contact night-coverage [email or web address]  01/30/2024, 7:27 AM

## 2024-01-30 NOTE — Progress Notes (Signed)
 Progress Note     Subjective: Patient reports abdominal pain of the right flank lower pelvis. She feels this is manageable at this time. Trying not to use pain medications if tolerable. Having bowel movements and flatulence. Urinating well. Reports some nausea. Denies vomiting. Is on FLD but has not tried anything yet.   ROS  All negative with the exception of above.  Objective: Vital signs in last 24 hours: Temp:  [98.2 F (36.8 C)-98.7 F (37.1 C)] 98.7 F (37.1 C) (10/23 0612) Pulse Rate:  [81-97] 85 (10/23 0612) Resp:  [16-17] 17 (10/23 0612) BP: (119-127)/(76-82) 127/77 (10/23 0612) SpO2:  [93 %-97 %] 94 % (10/23 0612) Last BM Date : 01/28/24  Intake/Output from previous day: 10/22 0701 - 10/23 0700 In: 960 [P.O.:960] Out: -  Intake/Output this shift: No intake/output data recorded.  PE: General: Pleasant female who is laying in bed in NAD. HEENT: Head is normocephalic, atraumatic. Heart: HR normal.  Lungs: Respiratory effort nonlabored. Abd: Soft, ND. Tenderness to palpation of right upper quadrant, epigastric region, and central lower abdomen/pelvis. +BS. No rebound tenderness or guarding.  Skin: Warm and dry. Psych: A&Ox3 with an appropriate affect.    Lab Results:  Recent Labs    01/29/24 0509 01/30/24 0454  WBC 5.9 5.4  HGB 9.1* 9.5*  HCT 28.7* 31.0*  PLT 411* 472*   BMET No results for input(s): NA, K, CL, CO2, GLUCOSE, BUN, CREATININE, CALCIUM in the last 72 hours. PT/INR No results for input(s): LABPROT, INR in the last 72 hours. CMP     Component Value Date/Time   NA 136 01/27/2024 0412   K 3.9 01/27/2024 0412   CL 106 01/27/2024 0412   CO2 20 (L) 01/27/2024 0412   GLUCOSE 112 (H) 01/27/2024 0412   BUN 12 01/27/2024 0412   CREATININE 0.60 01/27/2024 0412   CREATININE 0.77 04/09/2019 1040   CALCIUM 8.4 (L) 01/27/2024 0412   PROT 5.8 (L) 01/27/2024 0412   ALBUMIN 3.4 (L) 01/27/2024 0412   AST 35 01/27/2024 0412    ALT <5 01/27/2024 0412   ALKPHOS 44 01/27/2024 0412   BILITOT 0.5 01/27/2024 0412   GFRNONAA >60 01/27/2024 0412   GFRNONAA 94 04/09/2019 1040   GFRAA 109 04/09/2019 1040   Lipase     Component Value Date/Time   LIPASE 14 01/26/2024 0557       Studies/Results: No results found.  Anti-infectives: Anti-infectives (From admission, onward)    None        Assessment/Plan Colon Mass - CT A/P with irregular mural thickening and luminal narrowing suspicious for an underlying obstructing colonic mass of the distal sigmoid colon.  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 Bilateral ovarian masses are seen. 2.9 cm rounded consolidation along the posterior superior segment right lower lobe. Scattered pulmonary nodules measuring up to 9 mm in the left upper lobe and Right paratracheal lymphadenopathy.  - Colonoscopy 10/21 circumferential partially obstructing large mass was found in the recto- sigmoid colon, in the sigmoid colon, at 14 cm proximal to the anus and at 20 cm proximal to the anus measuring six cm in length. Area was tattooed. To note, during colonoscopy the entire examined portion of the colon was grossly redundant leading to looping as an effect of the lesion described above causing significant dilation of upstream colon.  - Path still pending.  Per discussion with GYN ONC prelim biopsies show adenoca, favor colon primary and IHC will take another 1-2 days.  - CEA 15.51, CA 125 64.3, AFP wnl.  - Afebrile. Vitals stable. - Having bowel function. Denies hematochezia. No vomiting. Pain manageable at this time. - HGB today 9.5; WBC 5.4. Continue to monitor hemoglobin.  - No surgical intervention recommended. Continue with non operative manageable.  - Okay to have FLD.  - Oncology is  following and recommending chemo and possibly immunotherapy.  - GI was also consulted. - IR port placement planned.  - Please contact if obstructive symptoms arise.   FEN: FLD, Miralax, IVF per [facility] VTE: SCDS,  okay for chem ppx from a general surgery standpoint  ID: None currently.    LOS: 3 days   I reviewed nursing notes, specialist notes, hospitalist notes, last 24 h vitals and pain scores, last 48 h intake and output, last 24 h labs and trends, and last 24 h imaging results.  This care required moderate level of medical decision making.    Marjorie Carlyon Favre, [facility] 01/30/2024, 8:37 AM Please see Amion for pager number during day hours 7:00am-4:30pm

## 2024-01-30 NOTE — Consult Note (Addendum)
 Chief Complaint: Newly diagnosed metastatic colon cancer; referred for Port-A-Cath placement to assist with treatment  Referring Provider(s): Feng,Y  Supervising Physician: Luverne Aran  Patient Status: [facility] - In-pt  History of Present Illness: Abigail Hicks is a 48 y.o. female with past medical history of asthma, hyperlipidemia, vitamin D  deficiency and recently diagnosed metastatic colorectal cancer.  Request now received for Port-A-Cath placement to assist with treatment.   Patient is Full Code  Past Medical History:  Diagnosis Date   Asthma 04/09/2019   no medications since 20s   Mixed hyperlipidemia 01/26/2015   Vitamin D  deficiency     Past Surgical History:  Procedure Laterality Date   COLONOSCOPY N/A 01/28/2024   Procedure: COLONOSCOPY;  Surgeon: Mansouraty, Aloha Raddle., MD;  Location: [facility];  Service: Gastroenterology;  Laterality: N/A;   NO PAST SURGERIES      Allergies: Pneumovax [pneumococcal polysaccharide vaccine] and Sulfamethoxazole-trimethoprim  Medications: Prior to Admission medications   Medication Sig Start Date End Date Taking? Authorizing Provider  Multiple Vitamin (MULTIVITAMIN) tablet Take 1 tablet by mouth daily.   Yes [provider]  oxyCODONE-acetaminophen (PERCOCET) 5-325 MG tablet Take 1 tablet by mouth every 6 (six) hours as needed for moderate pain (pain score 4-6) or severe pain (pain score 7-10). 01/26/24  Yes Nivia Colon, PA-C  senna-docusate (SENOKOT-S) 8.6-50 MG tablet Take 1-2 tablets by mouth at bedtime. Do not take if having diarrhea. Can start with one tablet initially. 01/23/24  Yes Cross, Melissa D, NP  traMADol (ULTRAM) 50 MG tablet Take 1 tablet (50 mg total) by mouth every 6 (six) hours as needed for moderate pain (pain score 4-6). For AFTER surgery only, do not take and drive 89/83/74  Yes Cross, Eleanor BIRCH, NP     Family History  Problem Relation Age of Onset   Arthritis Mother        Rhumatoid    Multiple myeloma Father 65   Breast cancer Sister    Lupus Maternal Grandmother    Heart attack Maternal Grandfather    Colon cancer Maternal Grandfather    Heart attack Paternal Grandfather    Heart attack Maternal Uncle 80   Stroke Maternal Uncle    Ovarian cancer Neg Hx    Endometrial cancer Neg Hx    Pancreatic cancer Neg Hx    Prostate cancer Neg Hx     Social History   Socioeconomic History   Marital status: Married    Spouse name: Not on file   Number of children: 2   Years of education: Not on file   Highest education level: Not on file  Occupational History   Not on file  Tobacco Use   Smoking status: Never   Smokeless tobacco: Never  Vaping Use   Vaping status: Never Used  Substance and Sexual Activity   Alcohol use: Not Currently   Drug use: No   Sexual activity: Yes    Partners: Male    Birth control/protection: Condom  Other Topics Concern   Not on file  Social History Narrative   Patient is married with 69 and 12 year old children   Employed by [employer] school system   Social Drivers of Health   Financial Resource Strain: Not on file  Food Insecurity: No Food Insecurity (01/26/2024)   Hunger Vital Sign    Worried About Running Out of Food in the Last Year: Never true    Ran Out of Food in the Last Year: Never true  Transportation Needs: No  Transportation Needs (01/26/2024)   PRAPARE - Administrator, Civil Service (Medical): No    Lack of Transportation (Non-Medical): No  Physical Activity: Insufficiently Active (04/08/2018)   Exercise Vital Sign    Days of Exercise per Week: 2 days    Minutes of Exercise per Session: 60 min  Stress: No Stress Concern Present (04/08/2018)   [employer] of Occupational Health - Occupational Stress Questionnaire    Feeling of Stress : Not at all  Social Connections: Not on file       Review of Systems currently denies fever, headache, chest pain, worsening dyspnea, cough, nausea,  vomiting or bleeding.  She does have some abdominal and back discomfort.  Vital Signs: BP 127/77 (BP Location: Left Arm)   Pulse 85   Temp 98.7 F (37.1 C) (Oral)   Resp 17   Ht 5' 8 (1.727 m)   Wt 155 lb (70.3 kg)   SpO2 94%   BMI 23.57 kg/m   Advance Care Plan: No documents on file  Physical Exam awake, alert.  Chest with slightly diminished breath sounds at bases.  Heart with regular rate and rhythm.  Abdomen slightly distended, positive bowel sounds, some tenderness to palpation lower abdominal region.  No lower extremity edema.  Imaging: CT CHEST W CONTRAST Result Date: 01/27/2024 CLINICAL DATA:  Known pelvic mass. Indeterminate right middle lobe nodule on CT abdomen and pelvis. * Tracking Code: BO * EXAM: CT CHEST WITH CONTRAST TECHNIQUE: Multidetector CT imaging of the chest was performed during intravenous contrast administration. RADIATION DOSE REDUCTION: This exam was performed according to the departmental dose-optimization program which includes automated exposure control, adjustment of the mA and/or kV according to patient size and/or use of iterative reconstruction technique. CONTRAST:  75mL OMNIPAQUE IOHEXOL 300 MG/ML  SOLN COMPARISON:  CT abdomen and pelvis dated 01/26/2024 FINDINGS: Cardiovascular: Normal heart size. No significant pericardial fluid/thickening. Great vessels are normal in course and caliber. No central pulmonary emboli. Mediastinum/Nodes: Imaged thyroid  gland without nodules meeting criteria for imaging follow-up by size. Normal esophagus. 11 mm right paratracheal lymphadenopathy (2:48). Lungs/Pleura: The central airways are patent. Bilateral lower lobe relaxation atelectasis. 2.9 x 2.5 cm rounded consolidation along the posterior superior segment right lower lobe (8:67). Scattered pulmonary nodules: -2 mm right apex (8:36) and left upper lobe (8:66) -9 x 5 mm left upper lobe perifissural (8:71) -7 x 4 mm right lower lobe peribronchovascular (8:73) -18 x 8 mm  medial right middle lobe (8:93) No pneumothorax. New trace right and small left pleural effusions. Upper abdomen: Moderate volume ascites. Please see separately dictated recent CT abdomen and pelvis report for detailed findings. Musculoskeletal: No acute or abnormal lytic or blastic osseous lesions. IMPRESSION: 1. A 2.9 cm rounded consolidation along the posterior superior segment right lower lobe, suspicious for metastatic disease in the setting of known pelvic mass. Pneumonia can have a similar appearance if there are concurrent infectious symptoms. 2. Scattered pulmonary nodules measuring up to 9 mm in the left upper lobe, indeterminate, but suspicious for metastatic disease. 3. Right paratracheal lymphadenopathy, indeterminate. 4. New trace right and small left pleural effusions. 5. Moderate volume ascites. Electronically Signed   By: Limin  Xu M.D.   On: 01/27/2024 15:59   CT ABDOMEN PELVIS W CONTRAST Result Date: 01/26/2024 EXAM: CT ABDOMEN AND PELVIS WITH CONTRAST 01/26/2024 08:38:00 AM TECHNIQUE: CT of the abdomen and pelvis was performed with the administration of intravenous contrast, 100mL (iohexol (OMNIPAQUE) 300 MG/ML solution 100 mL IOHEXOL 300 MG/ML  SOLN). Multiplanar reformatted images are provided for review. Automated exposure control, iterative reconstruction, and/or weight-based adjustment of the mA/kV was utilized to reduce the radiation dose to as low as reasonably achievable. COMPARISON: None available. CLINICAL HISTORY: Patient presents with a complaint of an intra-abdominal mass that shifted, causing abdominal pain. The patient is scheduled for a colonoscopy on Monday for diagnostic purposes. The patient reports that the mass moved, and immediately started having abdominal pain. *tracking code: Bo* FINDINGS: LOWER CHEST: A 10 mm indeterminate pulmonary nodule is seen along the paramediastinal right middle lobe at the visualized right lung base, along the pericardium, image 14/6. LIVER:  Focal fatty hepatic infiltration is seen along the falciform ligament. There is mild hepatic steatosis and mild hepatomegaly. No enhancing intrahepatic mass or intrahepatic or extrahepatic biliary ductal dilation is present. GALLBLADDER AND BILE DUCTS: Macro and CT gallstones are present. The gallbladder is otherwise unremarkable. No biliary ductal dilatation is seen. SPLEEN: No acute abnormality is noted. PANCREAS: No acute abnormality is noted. ADRENAL GLANDS: No acute abnormality is noted. KIDNEYS, URETERS AND BLADDER: Scattered simple cysts are seen within the kidneys bilaterally, for which no follow up imaging is recommended. The kidneys are otherwise unremarkable, with no stones, hydronephrosis, perinephric or periureteral stranding. The urinary bladder is unremarkable. GI AND BOWEL: There is a large colonic stool burden with a focal point of transition involvement in the distal sigmoid colon, where there is slightly irregular mural thickening and luminal narrowing, best seen at image 72/5, sagittal image 102/10, and coronal image 74/8, suspicious for an underlying obstructing colonic mass. Adjacent pathologic adenopathy is seen within the sigmoid colonic mesentery, measuring 1.6 x 2.7 cm at image 66/2, in keeping with regional nodal metastatic disease. Moderate ascites, peritoneal enhancement, and omental infiltration are seen, in keeping with signs of peritoneal carcinomatosis. REPRODUCTIVE ORGANS: Bilateral ovarian masses are seen, with the dominant septate, regularly enhancing mass involving the right ovary, measuring 10.5 x 14.6 x 12.5 cm. On the left, the mixed cystic and solid enhancing mass measures 5 x 3.4 cm. 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 BONES AND SOFT TISSUES: Bilateral L5 pars defects are seen. Osseous structures are age-appropriate, with no acute bone abnormality or lytic or blastic bone lesion. VASCULATURE: No  mention of vascular abnormalities is made. LYMPH NODES: No lymphadenopathy is noted, except for the pathologic adenopathy mentioned in the GI and BOWEL section. PERITONEUM AND RETROPERITONEUM: Moderate ascites is present, with peritoneal enhancement and omental infiltration, in keeping with signs of peritoneal carcinomatosis. IMPRESSION: 1. 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 2. 10 mm indeterminate pulmonary nodule in the right middle lobe. Dedicated nonemergency imaging of the chest is recommended for staging purposes and further evaluation. . Electronically signed by: Dorethia Molt MD 01/26/2024 09:02 AM EDT RP Workstation: [id number]   US  PELVIC DOPPLER (TORSION R/O OR MASS ARTERIAL FLOW) Result Date: 01/26/2024 EXAM: US  Pelvis, Complete Transvaginal and Transabdominal with Doppler 01/26/2024 07:53:00 AM TECHNIQUE: Transabdominal and transvaginal pelvic duplex ultrasound using B-mode/gray scaled imaging with Doppler spectral analysis and color flow was obtained. COMPARISON: None available CLINICAL HISTORY: Pelvic mass. FINDINGS: UTERUS: Uterus measures 9.1 x 4.4 x 5.6 cm, with an estimated volume of 116 ml. Uterus demonstrates normal myometrial echotexture. ENDOMETRIAL STRIPE: Endometrial measures 12  mm. Endometrial stripe is within normal limits. RIGHT OVARY: There is a complex solid and cystic right adnexal mass measuring approximately 18.2 x 9.7 x 14.3 cm. This mass is mildly vascular. LEFT OVARY: The left ovary measures 3.3 x 1.8 x 2.7 cm, with an estimated volume of 8.6 ml. There is a complex cyst within the left ovary measuring approximately 2.6 x 2.0 x 2.2 cm. There is normal arterial and venous Doppler flow  to the left ovary. FREE FLUID: There is moderate free fluid. IMPRESSION: 1. Complex solid and cystic right adnexal mass measuring approximately 18.2 x 9.7 x 14.3 cm, mildly vascular, concerning for neoplasm. Gynecology consultation is recommended. 2. Complex cyst within the left ovary measuring approximately 2.6 x 2.0 x 2.2 cm. 3. Moderate free fluid. Electronically signed by: Evalene Coho MD 01/26/2024 08:18 AM EDT RP Workstation: [id number]   US  PELVIS (TRANSABDOMINAL ONLY) Result Date: 01/26/2024 EXAM: US  Pelvis, Complete Transvaginal and Transabdominal with Doppler 01/26/2024 07:53:00 AM TECHNIQUE: Transabdominal and transvaginal pelvic duplex ultrasound using B-mode/gray scaled imaging with Doppler spectral analysis and color flow was obtained. COMPARISON: None available CLINICAL HISTORY: Pelvic mass. FINDINGS: UTERUS: Uterus measures 9.1 x 4.4 x 5.6 cm, with an estimated volume of 116 ml. Uterus demonstrates normal myometrial echotexture. ENDOMETRIAL STRIPE: Endometrial measures 12 mm. Endometrial stripe is within normal limits. RIGHT OVARY: There is a complex solid and cystic right adnexal mass measuring approximately 18.2 x 9.7 x 14.3 cm. This mass is mildly vascular. LEFT OVARY: The left ovary measures 3.3 x 1.8 x 2.7 cm, with an estimated volume of 8.6 ml. There is a complex cyst within the left ovary measuring approximately 2.6 x 2.0 x 2.2 cm. There is normal arterial and venous Doppler flow to the left ovary. FREE FLUID: There is moderate free fluid. IMPRESSION: 1. Complex solid and cystic right adnexal mass measuring approximately 18.2 x 9.7 x 14.3 cm, mildly vascular, concerning for neoplasm. Gynecology consultation is recommended. 2. Complex cyst within the left ovary measuring approximately 2.6 x 2.0 x 2.2 cm. 3. Moderate free fluid. Electronically signed by: Evalene Coho MD 01/26/2024 08:18 AM EDT RP Workstation: [id number]    Labs:  CBC: Recent Labs    01/26/24 0557  01/27/24 0412 01/29/24 0509 01/30/24 0454  WBC 8.6 13.7* 5.9 5.4  HGB 12.7 10.2* 9.1* 9.5*  HCT 40.7 34.0* 28.7* 31.0*  PLT 586* 468* 411* 472*    COAGS: No results for input(s): INR, APTT in the last 8760 hours.  BMP: Recent Labs    01/26/24 0557 01/27/24 0412  NA 141 136  K 3.7 3.9  CL 106 106  CO2 23 20*  GLUCOSE 103* 112*  BUN 10 12  CALCIUM 9.2 8.4*  CREATININE 0.72 0.60  GFRNONAA >60 >60    LIVER FUNCTION TESTS: Recent Labs    01/26/24 0557 01/27/24 0412  BILITOT 0.4 0.5  AST 24 35  ALT <5 <5  ALKPHOS 53 44  PROT 7.0 5.8*  ALBUMIN 4.0 3.4*    TUMOR MARKERS: Recent Labs    01/23/24 1017  CEA 15.51*    Assessment and Plan: 48 y.o. female with past medical history of asthma, hyperlipidemia, vitamin D  deficiency and recently diagnosed metastatic colorectal cancer.  Request now received for Port-A-Cath placement to assist with treatment.Risks and benefits of image guided port-a-catheter placement was discussed with the patient/spouse including, but not limited to bleeding, infection, pneumothorax, or fibrin sheath development and need for additional procedures.  All of the patient's questions were answered, patient  is agreeable to proceed. Consent signed and in chart.  Procedure to be done either later today or tomorrow  Thank you for allowing our service to participate in Abigail Hicks 's care.  Electronically Signed: D. Franky Rakers, PA-C   01/30/2024, 11:15 AM      I spent a total of  25 minutes   in face to face in clinical consultation, greater than 50% of which was counseling/coordinating care for port a cath placement

## 2024-01-30 NOTE — Plan of Care (Signed)
   Problem: Health Behavior/Discharge Planning: Goal: Ability to manage health-related needs will improve Outcome: Progressing   Problem: Clinical Measurements: Goal: Ability to maintain clinical measurements within normal limits will improve Outcome: Progressing Goal: Will remain free from infection Outcome: Progressing Goal: Diagnostic test results will improve Outcome: Progressing

## 2024-01-30 NOTE — Progress Notes (Addendum)
 Abigail Hicks   DOB:02-06-1976   FM#:[id number]   RDW#:[id number]  Med/onc follow up   Subjective: Patient is on soft diet, tolerating well, no BM today, but does pass flatus.  No complaints of abdominal pain or nausea.  Port placement by [employer] tomorrow.   Objective:  Vitals:   01/30/24 0612 01/30/24 1403  BP: 127/77 114/75  Pulse: 85 91  Resp: 17 18  Temp: 98.7 F (37.1 C) 98.6 F (37 C)  SpO2: 94% 97%    Body mass index is 23.57 kg/m.  Intake/Output Summary (Last 24 hours) at 01/30/2024 1726 Last data filed at 01/30/2024 1400 Gross per 24 hour  Intake 960 ml  Output 0 ml  Net 960 ml     Sclerae unicteric  Oropharynx clear  No peripheral adenopathy  Lungs clear -- no rales or rhonchi  Heart regular rate and rhythm  Abdomen soft  MSK no focal spinal tenderness, no peripheral edema  Neuro nonfocal    CBG (last 3)  No results for input(s): GLUCAP in the last 72 hours.   Labs:  Lab Results  Component Value Date   WBC 5.4 01/30/2024   HGB 9.5 (L) 01/30/2024   HCT 31.0 (L) 01/30/2024   MCV 81.8 01/30/2024   PLT 472 (H) 01/30/2024   NEUTROABS 3,443 04/09/2019     Urine Studies No results for input(s): UHGB, CRYS in the last 72 hours.  Invalid input(s): UACOL, UAPR, USPG, UPH, UTP, UGL, UKET, UBIL, UNIT, UROB, ULEU, UEPI, UWBC, URBC, UBAC, CAST, UCOM, BILUA  Basic Metabolic Panel: Recent Labs  Lab 01/26/24 0557 01/27/24 0412  NA 141 136  K 3.7 3.9  CL 106 106  CO2 23 20*  GLUCOSE 103* 112*  BUN 10 12  CREATININE 0.72 0.60  CALCIUM 9.2 8.4*   GFR Estimated Creatinine Clearance: 86.8 mL/min (by C-G formula based on SCr of 0.6 mg/dL). Liver Function Tests: Recent Labs  Lab 01/26/24 0557 01/27/24 0412  AST 24 35  ALT <5 <5  ALKPHOS 53 44  BILITOT 0.4 0.5  PROT 7.0 5.8*  ALBUMIN 4.0 3.4*   Recent Labs  Lab 01/26/24 0557  LIPASE 14   No results for input(s): AMMONIA in the last 168  hours. Coagulation profile No results for input(s): INR, PROTIME in the last 168 hours.  CBC: Recent Labs  Lab 01/26/24 0557 01/27/24 0412 01/29/24 0509 01/30/24 0454  WBC 8.6 13.7* 5.9 5.4  HGB 12.7 10.2* 9.1* 9.5*  HCT 40.7 34.0* 28.7* 31.0*  MCV 83.2 84.8 83.2 81.8  PLT 586* 468* 411* 472*   Cardiac Enzymes: No results for input(s): CKTOTAL, CKMB, CKMBINDEX, TROPONINI in the last 168 hours. BNP: Invalid input(s): POCBNP CBG: No results for input(s): GLUCAP in the last 168 hours. D-Dimer No results for input(s): DDIMER in the last 72 hours. Hgb A1c No results for input(s): HGBA1C in the last 72 hours. Lipid Profile No results for input(s): CHOL, HDL, LDLCALC, TRIG, CHOLHDL, LDLDIRECT in the last 72 hours. Thyroid  function studies No results for input(s): TSH, T4TOTAL, T3FREE, THYROIDAB in the last 72 hours.  Invalid input(s): FREET3 Anemia work up Recent Labs    01/30/24 0454  FERRITIN 107  TIBC 248*  IRON 14*   Microbiology No results found for this or any previous visit (from the past 240 hours).    Studies:  No results found.  Assessment: 48 y.o. 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  I have requested MMR, and will request NGS such as [facility] after discharge. - She is scheduled for port placement tomorrow, okay to discharge after port placement. - Okay to advance to soft diet, with low residual diet.  I placed dietitian consult - I ordered a nutrition supplement to ensure for her - Patient would like to have cancer care at our [facility], I will transfer her care to my partner Dr. Cloretta. -OK to discharge home after port placement     [name], MD 01/30/2024  5:26 PM

## 2024-01-30 NOTE — Discharge Summary (Signed)
 Physician Discharge Summary  Abigail Hicks FMW:[id number] DOB: 11/18/1975 DOA: 01/26/2024  PCP: Nena Rosina CROME, PA-C  Admit date: 01/26/2024 Discharge date: 01/31/2024  Admitted From: Home Disposition: Home  Recommendations for Outpatient Follow-up:  Follow up with PCP in 1-2 weeks Follow-up with oncology as scheduled next week:  Home Health: None Equipment/Devices: None  Discharge Condition: Stable CODE STATUS: Full Diet recommendation:  Soft diet as discussed  Brief/Interim Summary: 48 y.o. female with medical history significant of acute urinary retention episode, asthma, vitamin D  deficiency who presented to the emergency department complaints of abdominal pain constipation.  Patient previously noted bilateral ovarian masses initially planned for further evaluation and treatment with gynecology oncology with Dr. 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  Patient had notable stenosis of the colon as well but is not obstructed.  At this time patient is being evaluated by GI, general surgery, oncology for further evaluation and treatment.  Current plan is to advance diet with full liquids and MiraLAX and evaluate for tolerance, patient is hoping to avoid ostomy and at this time it does not appear that a stent or colonoscopy would be of any particular value at this time.  There is also a notable 10 mm pulmonary nodule concerning for distant mets which would make this stage IV if metabolically active on PET.  Patient admitted as above with acute worsening abdominal pain, constipation with previously noted ovarian masses, repeat imaging unfortunately revealed large adnexal mass, biopsies consistent with adenocarcinoma of colon.  Lengthy discussion with multiple teams including oncology, GI and surgery as well as Gyn Onc in regards to treatment.  At this time patient is to have  port placement for outpatient chemo initiation with Dr. Lanny -discussed possible need for stenting or surgical diversion but given patient's tolerance of soft diet here we will hold off on any procedures at this time and focus on chemotherapy and quality of life.  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 - Biopsy, preliminary, confirms adenocarcinoma - Further testing and imaging pending per consults as above. - Initially thought to have obstructive pathology due to size of mass, at this time has been able to pass liquid stool with support and no clear signs of obstruction. -Port placement later today, outpatient chemotherapy with oncology, follow-up with GI and general surgery as indicated   Thrombocytosis: Questionably reactive in the setting of underlying malignancy Vitamin D  deficiency: Continue with Vitamin D  supplementation Asthma: No acute issues -currently on room air Iron deficiency anemia: Defer to outpatient planning for further supplementation and treatment  Discharge Instructions   Allergies as of 01/31/2024       Reactions   Pneumovax [pneumococcal Polysaccharide Vaccine] Other (See Comments)   Sulfamethoxazole-trimethoprim Rash   Unsure of reaction, childhood allergy         Medication List     TAKE these medications    multivitamin  tablet Take 1 tablet by mouth daily.   oxyCODONE-acetaminophen 5-325 MG tablet Commonly known as:  Percocet Take 1 tablet by mouth every 6 (six) hours as needed for moderate pain (pain score 4-6) or severe pain (pain score 7-10).   polyethylene glycol 17 g packet Commonly known as: MIRALAX / GLYCOLAX Take 17 g by mouth 2 (two) times daily.   senna-docusate 8.6-50 MG tablet Commonly known as: Senokot-S Take 1-2 tablets by mouth 2 (two) times daily. Do not take if having diarrhea. Can start with one tablet initially. What changed: when to take this   traMADol 50 MG tablet Commonly known as: ULTRAM Take 1 tablet (50 mg total) by mouth every 6 (six) hours as needed for moderate pain (pain score 4-6). For AFTER surgery only, do not take and drive        Allergies  Allergen Reactions   Pneumovax [Pneumococcal Polysaccharide Vaccine] Other (See Comments)   Sulfamethoxazole-Trimethoprim Rash    Unsure of reaction, childhood allergy     Consultations: GI, general surgery, oncology, GynOnc  Procedures/Studies: CT CHEST W CONTRAST Result Date: 01/27/2024 CLINICAL DATA:  Known pelvic mass. Indeterminate right middle lobe nodule on CT abdomen and pelvis. * Tracking Code: BO * EXAM: CT CHEST WITH CONTRAST TECHNIQUE: Multidetector CT imaging of the chest was performed during intravenous contrast administration. RADIATION DOSE REDUCTION: This exam was performed according to the departmental dose-optimization program which includes automated exposure control, adjustment of the mA and/or kV according to patient size and/or use of iterative reconstruction technique. CONTRAST:  75mL OMNIPAQUE IOHEXOL 300 MG/ML  SOLN COMPARISON:  CT abdomen and pelvis dated 01/26/2024 FINDINGS: Cardiovascular: Normal heart size. No significant pericardial fluid/thickening. Great vessels are normal in course and caliber. No central pulmonary emboli. Mediastinum/Nodes: Imaged thyroid  gland without nodules meeting criteria for imaging follow-up by size. Normal esophagus. 11 mm right paratracheal lymphadenopathy (2:48).  Lungs/Pleura: The central airways are patent. Bilateral lower lobe relaxation atelectasis. 2.9 x 2.5 cm rounded consolidation along the posterior superior segment right lower lobe (8:67). Scattered pulmonary nodules: -2 mm right apex (8:36) and left upper lobe (8:66) -9 x 5 mm left upper lobe perifissural (8:71) -7 x 4 mm right lower lobe peribronchovascular (8:73) -18 x 8 mm medial right middle lobe (8:93) No pneumothorax. New trace right and small left pleural effusions. Upper abdomen: Moderate volume ascites. Please see separately dictated recent CT abdomen and pelvis report for detailed findings. Musculoskeletal: No acute or abnormal lytic or blastic osseous lesions. IMPRESSION: 1. A 2.9 cm rounded consolidation along the posterior superior segment right lower lobe, suspicious for metastatic disease in the setting of known pelvic mass. Pneumonia can have a similar appearance if there are concurrent infectious symptoms. 2. Scattered pulmonary nodules measuring up to 9 mm in the left upper lobe, indeterminate, but suspicious for metastatic disease. 3. Right paratracheal lymphadenopathy, indeterminate. 4. New trace right and small left pleural effusions. 5. Moderate volume ascites. Electronically Signed   By: Limin  Xu M.D.   On: 01/27/2024 15:59   CT ABDOMEN PELVIS W CONTRAST Result Date: 01/26/2024 EXAM: CT ABDOMEN AND PELVIS WITH CONTRAST 01/26/2024 08:38:00 AM TECHNIQUE: CT of the abdomen and pelvis was performed with the administration of intravenous contrast, 100mL (iohexol (OMNIPAQUE) 300 MG/ML solution 100 mL IOHEXOL 300 MG/ML SOLN). Multiplanar reformatted images are provided for review. Automated exposure control, iterative reconstruction, and/or weight-based adjustment of the mA/kV was utilized to reduce the radiation dose to as low as reasonably achievable. COMPARISON: None available. CLINICAL HISTORY:  Patient presents with a complaint of an intra-abdominal mass that shifted, causing abdominal pain.  The patient is scheduled for a colonoscopy on Monday for diagnostic purposes. The patient reports that the mass moved, and immediately started having abdominal pain. *tracking code: Bo* FINDINGS: LOWER CHEST: A 10 mm indeterminate pulmonary nodule is seen along the paramediastinal right middle lobe at the visualized right lung base, along the pericardium, image 14/6. LIVER: Focal fatty hepatic infiltration is seen along the falciform ligament. There is mild hepatic steatosis and mild hepatomegaly. No enhancing intrahepatic mass or intrahepatic or extrahepatic biliary ductal dilation is present. GALLBLADDER AND BILE DUCTS: Macro and CT gallstones are present. The gallbladder is otherwise unremarkable. No biliary ductal dilatation is seen. SPLEEN: No acute abnormality is noted. PANCREAS: No acute abnormality is noted. ADRENAL GLANDS: No acute abnormality is noted. KIDNEYS, URETERS AND BLADDER: Scattered simple cysts are seen within the kidneys bilaterally, for which no follow up imaging is recommended. The kidneys are otherwise unremarkable, with no stones, hydronephrosis, perinephric or periureteral stranding. The urinary bladder is unremarkable. GI AND BOWEL: There is a large colonic stool burden with a focal point of transition involvement in the distal sigmoid colon, where there is slightly irregular mural thickening and luminal narrowing, best seen at image 72/5, sagittal image 102/10, and coronal image 74/8, suspicious for an underlying obstructing colonic mass. Adjacent pathologic adenopathy is seen within the sigmoid colonic mesentery, measuring 1.6 x 2.7 cm at image 66/2, in keeping with regional nodal metastatic disease. Moderate ascites, peritoneal enhancement, and omental infiltration are seen, in keeping with signs of peritoneal carcinomatosis. REPRODUCTIVE ORGANS: Bilateral ovarian masses are seen, with the dominant septate, regularly enhancing mass involving the right ovary, measuring 10.5 x 14.6 x 12.5  cm. On the left, the mixed cystic and solid enhancing mass measures 5 x 3.4 cm. 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 BONES AND SOFT TISSUES: Bilateral L5 pars defects are seen. Osseous structures are age-appropriate, with no acute bone abnormality or lytic or blastic bone lesion. VASCULATURE: No mention of vascular abnormalities is made. LYMPH NODES: No lymphadenopathy is noted, except for the pathologic adenopathy mentioned in the GI and BOWEL section. PERITONEUM AND RETROPERITONEUM: Moderate ascites is present, with peritoneal enhancement and omental infiltration, in keeping with signs of peritoneal carcinomatosis. IMPRESSION: 1. 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 2. 10 mm indeterminate pulmonary nodule in the right middle lobe. Dedicated nonemergency imaging of the chest is recommended for staging purposes and further evaluation. . Electronically signed by: Dorethia Molt MD 01/26/2024 09:02 AM EDT RP Workstation: [id number]   US  PELVIC DOPPLER (TORSION R/O OR MASS ARTERIAL FLOW) Result Date: 01/26/2024 EXAM: US  Pelvis, Complete Transvaginal and Transabdominal with Doppler 01/26/2024 07:53:00 AM TECHNIQUE: Transabdominal and transvaginal pelvic duplex ultrasound using B-mode/gray scaled imaging with Doppler spectral analysis and color flow was obtained. COMPARISON: None available CLINICAL HISTORY: Pelvic mass. FINDINGS: UTERUS: Uterus measures 9.1 x 4.4 x 5.6 cm, with an estimated volume of 116 ml. Uterus demonstrates normal myometrial echotexture. ENDOMETRIAL STRIPE: Endometrial measures 12 mm. Endometrial stripe is within normal  limits. RIGHT OVARY: There is a complex solid and cystic right adnexal mass measuring approximately 18.2 x 9.7 x 14.3 cm. This mass is mildly vascular. LEFT OVARY: The left  ovary measures 3.3 x 1.8 x 2.7 cm, with an estimated volume of 8.6 ml. There is a complex cyst within the left ovary measuring approximately 2.6 x 2.0 x 2.2 cm. There is normal arterial and venous Doppler flow to the left ovary. FREE FLUID: There is moderate free fluid. IMPRESSION: 1. Complex solid and cystic right adnexal mass measuring approximately 18.2 x 9.7 x 14.3 cm, mildly vascular, concerning for neoplasm. Gynecology consultation is recommended. 2. Complex cyst within the left ovary measuring approximately 2.6 x 2.0 x 2.2 cm. 3. Moderate free fluid. Electronically signed by: Evalene Coho MD 01/26/2024 08:18 AM EDT RP Workstation: [id number]   US  PELVIS (TRANSABDOMINAL ONLY) Result Date: 01/26/2024 EXAM: US  Pelvis, Complete Transvaginal and Transabdominal with Doppler 01/26/2024 07:53:00 AM TECHNIQUE: Transabdominal and transvaginal pelvic duplex ultrasound using B-mode/gray scaled imaging with Doppler spectral analysis and color flow was obtained. COMPARISON: None available CLINICAL HISTORY: Pelvic mass. FINDINGS: UTERUS: Uterus measures 9.1 x 4.4 x 5.6 cm, with an estimated volume of 116 ml. Uterus demonstrates normal myometrial echotexture. ENDOMETRIAL STRIPE: Endometrial measures 12 mm. Endometrial stripe is within normal limits. RIGHT OVARY: There is a complex solid and cystic right adnexal mass measuring approximately 18.2 x 9.7 x 14.3 cm. This mass is mildly vascular. LEFT OVARY: The left ovary measures 3.3 x 1.8 x 2.7 cm, with an estimated volume of 8.6 ml. There is a complex cyst within the left ovary measuring approximately 2.6 x 2.0 x 2.2 cm. There is normal arterial and venous Doppler flow to the left ovary. FREE FLUID: There is moderate free fluid. IMPRESSION: 1. Complex solid and cystic right adnexal mass measuring  approximately 18.2 x 9.7 x 14.3 cm, mildly vascular, concerning for neoplasm. Gynecology consultation is recommended. 2. Complex cyst within the left ovary measuring approximately 2.6 x 2.0 x 2.2 cm. 3. Moderate free fluid. Electronically signed by: Evalene Coho MD 01/26/2024 08:18 AM EDT RP Workstation: [id number]     Subjective: No acute issues or events overnight denies nausea vomiting diarrhea constipation headache fevers chills or chest pain   Discharge Exam: Vitals:   01/31/24 0621 01/31/24 0850  BP: 123/82 129/84  Pulse: 77 77  Resp: 18 18  Temp: 98.4 F (36.9 C) 98.1 F (36.7 C)  SpO2: 98% 98%   Vitals:   01/30/24 1403 01/30/24 2119 01/31/24 0621 01/31/24 0850  BP: 114/75 121/76 123/82 129/84  Pulse: 91 86 77 77  Resp: 18 18 18 18   Temp: 98.6 F (37 C) 98.1 F (36.7 C) 98.4 F (36.9 C) 98.1 F (36.7 C)  TempSrc: Oral Oral Oral Oral  SpO2: 97% 98% 98% 98%  Weight:      Height:        General: Pt is alert, awake, not in acute distress Cardiovascular: RRR, S1/S2 +, no rubs, no gallops Respiratory: CTA bilaterally, no wheezing, no rhonchi Abdominal: Soft, NT, ND, bowel sounds + Extremities: no edema, no cyanosis    The results of significant diagnostics from this hospitalization (including imaging, microbiology, ancillary and laboratory) are listed below for reference.     Microbiology: No results found for this or any previous visit (from the past 240 hours).   Labs: BNP (last 3 results) No results for input(s): BNP in the last 8760 hours. Basic Metabolic Panel: Recent Labs  Lab 01/26/24 0557 01/27/24 0412  NA 141 136  K 3.7 3.9  CL 106 106  CO2 23 20*  GLUCOSE 103* 112*  BUN 10 12  CREATININE 0.72 0.60  CALCIUM  9.2 8.4*   Liver Function Tests: Recent Labs  Lab 01/26/24 0557 01/27/24 0412  AST 24 35  ALT <5 <5  ALKPHOS 53 44  BILITOT 0.4 0.5  PROT 7.0 5.8*  ALBUMIN 4.0 3.4*   Recent Labs  Lab 01/26/24 0557  LIPASE 14   No  results for input(s): AMMONIA in the last 168 hours. CBC: Recent Labs  Lab 01/26/24 0557 01/27/24 0412 01/29/24 0509 01/30/24 0454  WBC 8.6 13.7* 5.9 5.4  HGB 12.7 10.2* 9.1* 9.5*  HCT 40.7 34.0* 28.7* 31.0*  MCV 83.2 84.8 83.2 81.8  PLT 586* 468* 411* 472*   Cardiac Enzymes: No results for input(s): CKTOTAL, CKMB, CKMBINDEX, TROPONINI in the last 168 hours. BNP: Invalid input(s): POCBNP CBG: No results for input(s): GLUCAP in the last 168 hours. D-Dimer No results for input(s): DDIMER in the last 72 hours. Hgb A1c No results for input(s): HGBA1C in the last 72 hours. Lipid Profile No results for input(s): CHOL, HDL, LDLCALC, TRIG, CHOLHDL, LDLDIRECT in the last 72 hours. Thyroid  function studies No results for input(s): TSH, T4TOTAL, T3FREE, THYROIDAB in the last 72 hours.  Invalid input(s): FREET3 Anemia work up Recent Labs    01/30/24 0454  FERRITIN 107  TIBC 248*  IRON 14*   Urinalysis    Component Value Date/Time   COLORURINE YELLOW 01/26/2024 0703   APPEARANCEUR HAZY (A) 01/26/2024 0703   LABSPEC 1.023 01/26/2024 0703   PHURINE 5.0 01/26/2024 0703   GLUCOSEU NEGATIVE 01/26/2024 0703   HGBUR NEGATIVE 01/26/2024 0703   BILIRUBINUR NEGATIVE 01/26/2024 0703   KETONESUR 5 (A) 01/26/2024 0703   PROTEINUR NEGATIVE 01/26/2024 0703   NITRITE NEGATIVE 01/26/2024 0703   LEUKOCYTESUR SMALL (A) 01/26/2024 0703   Sepsis Labs Recent Labs  Lab 01/26/24 0557 01/27/24 0412 01/29/24 0509 01/30/24 0454  WBC 8.6 13.7* 5.9 5.4   Microbiology No results found for this or any previous visit (from the past 240 hours).   Time coordinating discharge: Over 30 minutes  SIGNED:   Elsie JAYSON Montclair, DO [facility] 01/31/2024, 11:45 AM Pager   If 7PM-7AM, please contact night-coverage [email or web address]

## 2024-01-31 ENCOUNTER — Other Ambulatory Visit (HOSPITAL_COMMUNITY): Payer: Self-pay

## 2024-01-31 ENCOUNTER — Encounter: Payer: Self-pay | Admitting: *Deleted

## 2024-01-31 ENCOUNTER — Ambulatory Visit: Payer: Self-pay | Admitting: Gastroenterology

## 2024-01-31 ENCOUNTER — Inpatient Hospital Stay (HOSPITAL_COMMUNITY)

## 2024-01-31 ENCOUNTER — Other Ambulatory Visit: Payer: Self-pay | Admitting: Oncology

## 2024-01-31 DIAGNOSIS — C187 Malignant neoplasm of sigmoid colon: Secondary | ICD-10-CM | POA: Insufficient documentation

## 2024-01-31 DIAGNOSIS — K6389 Other specified diseases of intestine: Secondary | ICD-10-CM | POA: Diagnosis not present

## 2024-01-31 HISTORY — PX: IR IMAGING GUIDED PORT INSERTION: IMG5740

## 2024-01-31 MED ORDER — CEFAZOLIN SODIUM-DEXTROSE 2-4 GM/100ML-% IV SOLN
2.0000 g | INTRAVENOUS | Status: AC
  Administered 2024-01-31: 2 g via INTRAVENOUS

## 2024-01-31 MED ORDER — MIDAZOLAM HCL 2 MG/2ML IJ SOLN
INTRAMUSCULAR | Status: AC
  Filled 2024-01-31: qty 2

## 2024-01-31 MED ORDER — FENTANYL CITRATE (PF) 100 MCG/2ML IJ SOLN
INTRAMUSCULAR | Status: AC
  Filled 2024-01-31: qty 2

## 2024-01-31 MED ORDER — FENTANYL CITRATE (PF) 100 MCG/2ML IJ SOLN
INTRAMUSCULAR | Status: AC | PRN
  Administered 2024-01-31 (×2): 50 ug via INTRAVENOUS

## 2024-01-31 MED ORDER — HEPARIN SOD (PORK) LOCK FLUSH 100 UNIT/ML IV SOLN
500.0000 [IU] | Freq: Once | INTRAVENOUS | Status: AC
  Administered 2024-01-31: 500 [IU] via INTRAVENOUS
  Filled 2024-01-31: qty 5

## 2024-01-31 MED ORDER — LIDOCAINE HCL 1 % IJ SOLN
INTRAMUSCULAR | Status: AC
  Filled 2024-01-31: qty 20

## 2024-01-31 MED ORDER — HEPARIN SOD (PORK) LOCK FLUSH 100 UNIT/ML IV SOLN
INTRAVENOUS | Status: AC
Start: 2024-01-31 — End: 2024-01-31
  Filled 2024-01-31: qty 5

## 2024-01-31 MED ORDER — MIDAZOLAM HCL (PF) 2 MG/2ML IJ SOLN
INTRAMUSCULAR | Status: AC | PRN
  Administered 2024-01-31 (×3): 1 mg via INTRAVENOUS

## 2024-01-31 MED ORDER — OXYCODONE-ACETAMINOPHEN 5-325 MG PO TABS
1.0000 | ORAL_TABLET | Freq: Four times a day (QID) | ORAL | 0 refills | Status: DC | PRN
  Filled 2024-01-31: qty 10, 3d supply, fill #0

## 2024-01-31 MED ORDER — POLYETHYLENE GLYCOL 3350 17 GM/SCOOP PO POWD
17.0000 g | Freq: Two times a day (BID) | ORAL | 0 refills | Status: AC
  Filled 2024-01-31: qty 238, 7d supply, fill #0

## 2024-01-31 MED ORDER — SENNOSIDES-DOCUSATE SODIUM 8.6-50 MG PO TABS
1.0000 | ORAL_TABLET | Freq: Two times a day (BID) | ORAL | 3 refills | Status: AC
  Filled 2024-01-31: qty 60, 15d supply, fill #0
  Filled 2024-03-20: qty 60, 15d supply, fill #1

## 2024-01-31 MED ORDER — CEFAZOLIN SODIUM-DEXTROSE 2-4 GM/100ML-% IV SOLN
INTRAVENOUS | Status: AC
  Filled 2024-01-31: qty 100

## 2024-01-31 MED ORDER — LIDOCAINE HCL 1 % IJ SOLN
20.0000 mL | Freq: Once | INTRAMUSCULAR | Status: AC
  Administered 2024-01-31: 15 mL via INTRADERMAL
  Filled 2024-01-31: qty 20

## 2024-01-31 NOTE — Progress Notes (Signed)
 START ON PATHWAY REGIMEN - Colorectal     A cycle is every 14 days:     Bevacizumab-xxxx      Irinotecan      Oxaliplatin      Leucovorin      Fluorouracil   **Always confirm dose/schedule in your pharmacy ordering system**  Patient Characteristics: Distant Metastases, Nonsurgical Candidate, Non-KRAS G12C, RAS Mutation Positive/Unknown (BRAF V600 Wild-Type/Unknown), Standard Cytotoxic Therapy, First Line Standard Cytotoxic Therapy, Bevacizumab Eligible, PS = 0,1 Tumor Location: Colon Therapeutic Status: Distant Metastases Microsatellite/Mismatch Repair Status: Unknown BRAF Mutation Status: Awaiting Test Results KRAS/NRAS Mutation Status: Awaiting Test Results Preferred Therapy Approach: Standard Cytotoxic Therapy Standard Cytotoxic Line of Therapy: First Line Standard Cytotoxic Therapy ECOG Performance Status: 1 Bevacizumab Eligibility: Eligible Intent of Therapy: Non-Curative / Palliative Intent, Discussed with Patient

## 2024-01-31 NOTE — Progress Notes (Signed)
 Initial Nutrition Assessment  DOCUMENTATION CODES:   Not applicable  INTERVENTION:  - Diet per MD.  - Recommend Ensure Plus High Protein po BID if patient remains admitted. Each supplement provides 350 kcal and 20 grams of protein.  - Plan for a soft, low residue diet on discharge. Provided Fiber Restricted Nutrition Therapy handout to patient.   NUTRITION DIAGNOSIS:   Inadequate oral intake related to poor appetite, early satiety, cancer and cancer related treatments (metastatic adenocarcinoma of the colon) as evidenced by energy intake < or equal to 50% for > or equal to 5 days.  GOAL:   Patient will meet greater than or equal to 90% of their needs  MONITOR:   PO intake, Supplement acceptance, Weight trends  REASON FOR ASSESSMENT:   Consult Assessment of nutrition requirement/status  ASSESSMENT:   48 y.o. female with PMH significant of acute urinary retention episode, asthma, vitamin D  deficiency who presented with complaints of abdominal pain and constipation. Patient admitted with presumed metastatic adenocarcinoma of the colon and bilateral ovarian masses as well as lung nodule.   Patient in bed at time of visit, husband at bedside.  UBW reported to be 155# and patient shares she weighed that on admission but suspects she has lost weight over the past few days due to not eating much. No weight taken since initial admit weight.  Patient reports that prior to her symptoms starting 1 month ago, she was eating normally. Typically eats cereal for breakfast, a sandwich with chips and fruit for lunch, and a home cooked meal of meat, a vegetable, and a starch.   About 1 month ago, she started developing early satiety and poor appetite and was eating less as a result.  Since admission, her intake has also been poor. She has only been on clear/full liquids. However, she notes she has also been limited due to frequently being NPO for procedures. Tried an Ensure last night and  tolerated it but didn't love it.  Per Oncologist Dr. Lanny, plan for a soft, low residue diet after discharge. Provided patient with Fiber Restricted Nutrition Therapy handout for the [employer]. Reviewed eating small and frequent meals (6-8) a day for better tolerance and avoiding GI trigger foods such as caffeine and those that are acidic and spicy. Reviewed list of foods in each food group that are low in fiber and those that are higher in fiber to avoid.  Addressed all questions from patient and husband. Encouraged intake of ONS after discharge to support meeting needs. Patient to discharge today after port placement.   Medications reviewed and include: Miralax  Labs reviewed:  No BMP since 10/20   NUTRITION - FOCUSED PHYSICAL EXAM:  Deferred  Diet Order:   Diet Order             Diet NPO time specified Except for: Sips with Meds  Diet effective midnight                   EDUCATION NEEDS:  Education needs have been addressed  Skin:  Skin Assessment: Reviewed RN Assessment  Last BM:  10/23 - type 7  Height:  Ht Readings from Last 1 Encounters:  01/28/24 5' 8 (1.727 m)   Weight:  Wt Readings from Last 1 Encounters:  01/28/24 70.3 kg    BMI:  Body mass index is 23.57 kg/m.  Estimated Nutritional Needs:  Kcal:  2100-2300 kcals Protein:  85-105 grams Fluid:  >/= 2.1L  Trude Ned RD, LDN Contact via [employer].

## 2024-01-31 NOTE — Progress Notes (Signed)
 ON PATHWAY REGIMEN - Colorectal  No Change  Continue With Treatment as Ordered.  Original Decision Date/Time: 01/31/2024 13:53     A cycle is every 14 days:     Bevacizumab-xxxx      Irinotecan      Oxaliplatin      Leucovorin      Fluorouracil   **Always confirm dose/schedule in your pharmacy ordering system**  Patient Characteristics: Distant Metastases, Nonsurgical Candidate, Non-KRAS G12C, RAS Mutation Positive/Unknown (BRAF V600 Wild-Type/Unknown), Standard Cytotoxic Therapy, First Line Standard Cytotoxic Therapy, Bevacizumab Eligible, PS = 0,1 Tumor Location: Colon Therapeutic Status: Distant Metastases Microsatellite/Mismatch Repair Status: Unknown BRAF Mutation Status: Awaiting Test Results KRAS/NRAS Mutation Status: Awaiting Test Results Preferred Therapy Approach: Standard Cytotoxic Therapy Standard Cytotoxic Line of Therapy: First Line Standard Cytotoxic Therapy ECOG Performance Status: 1 Bevacizumab Eligibility: Eligible Intent of Therapy: Non-Curative / Palliative Intent, Discussed with Patient

## 2024-01-31 NOTE — Progress Notes (Signed)
 Oncology Discharge Planning Note  [facility] Address: [address], [place], [place] [address] Hours of Operation:  [place], Monday - Friday  Clinic Contact Information:  [phone]  Oncology Care Team: Medical Oncologist:  Cloretta  Patient Details: Name:  Abigail Hicks, Abigail Hicks MRN:   [id number] DOB:   1975/07/04 Reason for Current Admission: @PPROB @  Discharge Planning Narrative: Notification of admission received by inpatient team for [facility].  Discharge follow-up appointments for oncology are current and available on the AVS and MyChart.   Upon discharge from the hospital, hematology/oncology's post discharge plan of care for the outpatient setting is:  October 28,2025 at 0945 with Olam Ned NP and Dr Cloretta [facility]  [address] [place], [place] [address]  [phone]   Francie Keeling will be called within two business days after discharge to review hematology/oncology's plan of care for full understanding.    Outpatient Oncology Specific Care Only: Oncology appointment transportation needs addressed?:  no Oncology medication management for symptom management addressed?:  not applicable Chemo Alert Card reviewed?:  not applicable Immunotherapy Alert Card reviewed?:  not applicable

## 2024-01-31 NOTE — Progress Notes (Addendum)
 IP PROGRESS NOTE  Subjective:   Abigail Hicks has been diagnosed with metastatic colon cancer.  She was evaluated by Dr.Feng during this hospital admission.  Abigail Hicks prefers to have treatment at the [facility].  Dr. Lanny asked me to assume the medical oncology care.  She has a history of constipation and lower abdominal fullness for the past several months.  She was evaluated by Dr. Viktoria after a CT abdomen/pelvis 01/17/2024 revealed an abdominopelvic cystic mass felt to most likely represent an ovarian neoplasm.  There was ascites, right hydroureteronephrosis and cysts involving the left ovary. A pelvic ultrasound 01/26/2024 revealed a complex solid and cystic right adnexal mass, a complex cyst in the left ovary and moderate free fluid.  She was admitted 01/26/2024.  A CT abdomen/pelvis revealed evidence of a colonic mass in the distal sigmoid colon, adjacent pathologic adenopathy, peritoneal carcinomatosis, and bilateral ovarian masses suspicious for metastatic disease.  Gastroenterology was consulted and she was taken to a colonoscopy on 01/28/2024.  A partial obstructing tumor was noted at the rectosigmoid colon at 14 cm proximal to the anus.  The mass was biopsied.  The pathology revealed adenocarcinoma, moderately differentiated, consistent with a colorectal primary.  Immunohistochemical stains found the tumor cells to be positive for CDX2 and negative for PAX8.  Abigail Hicks is having bowel movements.  She is scheduled for Port-A-Cath placement today.  She discussed the diagnosis with Dr. Lanny.  Dr. Lanny commands initial treatment with triple agent chemotherapy.  The tumor has been submitted for mismatch repair protein and NGS testing.  Past medical history: Asthma  Family history: 1.  Father had multiple myeloma 2.  Paternal grandfather had colon cancer  Social history: She lives with her husband and children in [place].  She is a Chartered loss adjuster.  She does not use cigarettes  or alcohol.  Objective: Vital signs in last 24 hours: Blood pressure 123/82, pulse 77, temperature 98.4 F (36.9 C), temperature source Oral, resp. rate 18, height 5' 8 (1.727 m), weight 155 lb (70.3 kg), SpO2 98%.  Intake/Output from previous day: 10/23 0701 - 10/24 0700 In: 1320 [P.O.:1320] Out: 0   Physical Exam:  Lungs: Lungs clear bilaterally Cardiac: Regular rate and rhythm Abdomen: Mildly distended with firm fullness at the mid lower abdomen Extremities: No leg edema Lymph nodes: No cervical, supraclavicular, axillary, or inguinal nodes  Portacath/PICC-without erythema  Lab Results: Recent Labs    01/29/24 0509 01/30/24 0454  WBC 5.9 5.4  HGB 9.1* 9.5*  HCT 28.7* 31.0*  PLT 411* 472*    BMET No results for input(s): NA, K, CL, CO2, GLUCOSE, BUN, CREATININE, CALCIUM in the last 72 hours.  Lab Results  Component Value Date   CEA 15.51 (H) 01/23/2024    Studies/Results: As per HPI, CT images reviewed Medications: I have reviewed the patient's current medications.  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) 01/17/2024 CT abdomen/pelvis: Large abdominopelvic cystic mass, arising from the right ovary, ascites, right hydroureteronephrosis to the level of the pelvic mass, left ovary cysts 01/26/2024 ultrasound pelvis: Complex solid and cystic right adnexal mass measuring 18.2 x 9.7 x 14.3 cm, complex left ovarian cyst, moderate free fluid 01/26/2024 CT abdomen/pelvis: Sigmoid colon mass, adjacent pathologic adenopathy, peritoneal carcinomatosis, bilateral ovarian masses suspicious for metastases, 10 mm indeterminate right middle lobe nodule 01/27/2024 CT chest: 2.9 cm rounded consolidation in the posterior right lower lobe suspicious for metastasis, scattered pulmonary nodules measuring up to 9 mm, indeterminate right paratracheal node, new trace  right and small left pleural effusions, moderate ascites 01/28/2024 colonoscopy:  Partially obstructing tumor at 14 cm - 20 cm proximal to the anus, biopsy-moderately differentiated adenocarcinoma, CDX2 positive Elevated CEA  2.  Constipation and abdominal/pelvic distention secondary to #1 3.  Anemia/thrombocytosis secondary #1 4.  Asthma  [name] has been diagnosed with metastatic colon cancer.  I discussed the prognosis and treatment options with her today.  She understands no therapy will be curative.  The goals of treatment are to palliate symptoms and extend survival.  Dr. Lanny recommends initiating systemic therapy with triplet therapy.  I think this is a reasonable approach given her young age and significant tumor burden.  I recommend FOLFOXIRI.  The treatment recommendation may change based on the results of mismatch repair protein testing and NGS testing.  She may be a candidate for immunotherapy, BRAF targeted therapy, or an eGFR inhibitor.  We reviewed potential toxicities associated with FOLFOXIRI regimen including the chance of nausea/vomiting, mucositis, diarrhea, alopecia, hematologic toxicity, infection, and bleeding.  We discussed the sun sensitivity, rash, hyperpigmentation, cardiac toxicity, and hand/foot syndrome associated with 5-fluorouracil.  We reviewed the allergic reaction and various types of neuropathy seen with oxaliplatin.  We discussed the alopecia and acute/delayed diarrhea seen with irinotecan.  She is scheduled undergo Port-A-Cath placement today.  She will return for an office visit and further discussion early next week.  The plan is to initiate systemic therapy on 02/05/2024.  A treatment plan was entered today.  LOS: 4 days   Arley Hof, MD   01/31/2024, 6:48 AM

## 2024-01-31 NOTE — Progress Notes (Signed)
Pt was discharged home today. Instructions were reviewed with patient, and questions were answered. Pt was taken to main entrance via wheelchair by NT.  

## 2024-01-31 NOTE — Progress Notes (Signed)
 DC medications delivered.

## 2024-01-31 NOTE — Procedures (Signed)
 Interventional Radiology Procedure Note  Procedure: Single Lumen Power Port Placement    Access:  Right IJ vein.  Findings: Catheter tip positioned at SVC/RA junction. Port is ready for immediate use.   Complications: None  EBL: < 10 mL  Recommendations:  - Ok to shower in 24 hours - Do not submerge for 7 days - Routine line care   Maxi Carreras T. Fredia Sorrow, M.D Pager:  [phone]

## 2024-02-02 ENCOUNTER — Other Ambulatory Visit: Payer: Self-pay

## 2024-02-02 ENCOUNTER — Encounter: Payer: Self-pay | Admitting: Oncology

## 2024-02-03 ENCOUNTER — Inpatient Hospital Stay

## 2024-02-03 LAB — SURGICAL PATHOLOGY

## 2024-02-04 ENCOUNTER — Other Ambulatory Visit: Payer: Self-pay | Admitting: *Deleted

## 2024-02-04 ENCOUNTER — Other Ambulatory Visit: Payer: Self-pay | Admitting: Nurse Practitioner

## 2024-02-04 ENCOUNTER — Inpatient Hospital Stay: Admitting: Nurse Practitioner

## 2024-02-04 ENCOUNTER — Encounter: Payer: Self-pay | Admitting: Nurse Practitioner

## 2024-02-04 ENCOUNTER — Inpatient Hospital Stay

## 2024-02-04 ENCOUNTER — Other Ambulatory Visit: Payer: Self-pay

## 2024-02-04 ENCOUNTER — Other Ambulatory Visit (HOSPITAL_BASED_OUTPATIENT_CLINIC_OR_DEPARTMENT_OTHER): Payer: Self-pay

## 2024-02-04 VITALS — BP 124/82 | HR 78 | Temp 97.8°F | Resp 18 | Ht 68.0 in | Wt 145.1 lb

## 2024-02-04 DIAGNOSIS — C187 Malignant neoplasm of sigmoid colon: Secondary | ICD-10-CM

## 2024-02-04 DIAGNOSIS — N939 Abnormal uterine and vaginal bleeding, unspecified: Secondary | ICD-10-CM | POA: Diagnosis not present

## 2024-02-04 DIAGNOSIS — Z1151 Encounter for screening for human papillomavirus (HPV): Secondary | ICD-10-CM | POA: Diagnosis not present

## 2024-02-04 DIAGNOSIS — Z124 Encounter for screening for malignant neoplasm of cervix: Secondary | ICD-10-CM | POA: Diagnosis not present

## 2024-02-04 DIAGNOSIS — R1900 Intra-abdominal and pelvic swelling, mass and lump, unspecified site: Secondary | ICD-10-CM | POA: Diagnosis present

## 2024-02-04 DIAGNOSIS — R630 Anorexia: Secondary | ICD-10-CM | POA: Diagnosis not present

## 2024-02-04 DIAGNOSIS — N941 Unspecified dyspareunia: Secondary | ICD-10-CM | POA: Diagnosis not present

## 2024-02-04 DIAGNOSIS — N3941 Urge incontinence: Secondary | ICD-10-CM | POA: Diagnosis not present

## 2024-02-04 DIAGNOSIS — R109 Unspecified abdominal pain: Secondary | ICD-10-CM | POA: Diagnosis not present

## 2024-02-04 DIAGNOSIS — K5900 Constipation, unspecified: Secondary | ICD-10-CM | POA: Diagnosis not present

## 2024-02-04 DIAGNOSIS — N898 Other specified noninflammatory disorders of vagina: Secondary | ICD-10-CM | POA: Diagnosis not present

## 2024-02-04 DIAGNOSIS — Z803 Family history of malignant neoplasm of breast: Secondary | ICD-10-CM | POA: Diagnosis not present

## 2024-02-04 DIAGNOSIS — R197 Diarrhea, unspecified: Secondary | ICD-10-CM | POA: Diagnosis not present

## 2024-02-04 DIAGNOSIS — R6881 Early satiety: Secondary | ICD-10-CM | POA: Diagnosis not present

## 2024-02-04 DIAGNOSIS — E559 Vitamin D deficiency, unspecified: Secondary | ICD-10-CM | POA: Diagnosis not present

## 2024-02-04 DIAGNOSIS — Z807 Family history of other malignant neoplasms of lymphoid, hematopoietic and related tissues: Secondary | ICD-10-CM | POA: Diagnosis not present

## 2024-02-04 LAB — CBC WITH DIFFERENTIAL (CANCER CENTER ONLY)
Abs Immature Granulocytes: 0.04 K/uL (ref 0.00–0.07)
Basophils Absolute: 0.1 K/uL (ref 0.0–0.1)
Basophils Relative: 1 %
Eosinophils Absolute: 0.1 K/uL (ref 0.0–0.5)
Eosinophils Relative: 2 %
HCT: 37 % (ref 36.0–46.0)
Hemoglobin: 11.7 g/dL — ABNORMAL LOW (ref 12.0–15.0)
Immature Granulocytes: 1 %
Lymphocytes Relative: 18 %
Lymphs Abs: 1.4 K/uL (ref 0.7–4.0)
MCH: 25.7 pg — ABNORMAL LOW (ref 26.0–34.0)
MCHC: 31.6 g/dL (ref 30.0–36.0)
MCV: 81.1 fL (ref 80.0–100.0)
Monocytes Absolute: 0.7 K/uL (ref 0.1–1.0)
Monocytes Relative: 10 %
Neutro Abs: 5.2 K/uL (ref 1.7–7.7)
Neutrophils Relative %: 68 %
Platelet Count: 521 K/uL — ABNORMAL HIGH (ref 150–400)
RBC: 4.56 MIL/uL (ref 3.87–5.11)
RDW: 12.3 % (ref 11.5–15.5)
WBC Count: 7.5 K/uL (ref 4.0–10.5)
nRBC: 0 % (ref 0.0–0.2)

## 2024-02-04 LAB — CMP (CANCER CENTER ONLY)
ALT: 29 U/L (ref 0–44)
AST: 56 U/L — ABNORMAL HIGH (ref 15–41)
Albumin: 4.5 g/dL (ref 3.5–5.0)
Alkaline Phosphatase: 59 U/L (ref 38–126)
Anion gap: 11 (ref 5–15)
BUN: 14 mg/dL (ref 6–20)
CO2: 26 mmol/L (ref 22–32)
Calcium: 10.5 mg/dL — ABNORMAL HIGH (ref 8.9–10.3)
Chloride: 103 mmol/L (ref 98–111)
Creatinine: 0.7 mg/dL (ref 0.44–1.00)
GFR, Estimated: >60 mL/min (ref >60)
Glucose, Bld: 96 mg/dL (ref 70–99)
Potassium: 4.3 mmol/L (ref 3.5–5.1)
Sodium: 140 mmol/L (ref 135–145)
Total Bilirubin: 0.3 mg/dL (ref 0.0–1.2)
Total Protein: 7.9 g/dL (ref 6.5–8.1)

## 2024-02-04 LAB — CEA (ACCESS): CEA (CHCC): 15.46 ng/mL — ABNORMAL HIGH (ref 0.00–5.00)

## 2024-02-04 LAB — PREGNANCY, URINE: Preg Test, Ur: NEGATIVE

## 2024-02-04 MED ORDER — ONDANSETRON 8 MG PO TBDP
8.0000 mg | ORAL_TABLET | Freq: Three times a day (TID) | ORAL | 0 refills | Status: DC | PRN
  Filled 2024-02-04: qty 18, 6d supply, fill #0

## 2024-02-04 MED ORDER — PROCHLORPERAZINE MALEATE 10 MG PO TABS
10.0000 mg | ORAL_TABLET | Freq: Four times a day (QID) | ORAL | 0 refills | Status: DC | PRN
  Filled 2024-02-04: qty 30, 8d supply, fill #0

## 2024-02-04 MED ORDER — LIDOCAINE-PRILOCAINE 2.5-2.5 % EX CREA
1.0000 | TOPICAL_CREAM | CUTANEOUS | 0 refills | Status: AC | PRN
  Filled 2024-02-04: qty 30, 30d supply, fill #0

## 2024-02-04 NOTE — Progress Notes (Signed)
 [facility] Clinical Social Work  Initial Assessment   Abigail Hicks is a 48 y.o. year old female accompanied by patient and spouse. Clinical Social Work was referred by nurse navigator for assessment of psychosocial needs.   SDOH (Social Determinants of Health) assessments performed: Yes   SDOH Screenings   Food Insecurity: No Food Insecurity (02/04/2024)  Housing: Low Risk  (02/04/2024)  Transportation Needs: No Transportation Needs (02/04/2024)  Utilities: Not At Risk (02/04/2024)  Depression (PHQ2-9): Low Risk  (02/04/2024)  Physical Activity: Insufficiently Active (04/08/2018)  Stress: No Stress Concern Present (04/08/2018)  Tobacco Use: Low Risk  (02/04/2024)    PHQ 2/9:    02/04/2024    2:51 PM 02/04/2024    9:00 AM 01/21/2024    3:22 PM  Depression screen PHQ 2/9  Decreased Interest 0 0 0  Down, Depressed, Hopeless 0 0 0  PHQ - 2 Score 0 0 0     Distress Screen completed: Yes    02/04/2024    1:00 PM  ONCBCN DISTRESS SCREENING  Screening Type Initial Screening  How much distress have you been experiencing in the past week? (0-10) 2  Practical concerns type --  Emotional concerns type --      Family/Social Information:  Housing Arrangement: patient lives with her spouse and two children. Family members/support persons in your life? Family and Friends Transportation concerns: No transportation Barriers noted.   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 Income source: Employment Financial concerns: No concerns at this time. However, Abigail Hicks and family briefly discussed concerns about medical bills. Type of concern: Medical bills Food access concerns: no Religious or spiritual practice: Not known Advanced directives: No  Coping/ Adjustment to diagnosis: Patient understands treatment plan and what happens next? yes Concerns about diagnosis and/or treatment: Abigail Hicks / Spouse  appeared to be in a upbeat mood during appointment. They both reported to have processed the diagnosis trust their faith and medicine to get them through. Abigail Hicks discussed two side effects of treatment she's hopeful will not take place.  Patient reported stressors: Effects of treatment. Hopes and/or priorities: To return back to work Current coping skills/ strengths: Ability for insight , Active sense of humor , Average or above average intelligence , Capable of independent living , Manufacturing systems engineer , Contractor , General fund of knowledge , Motivation for treatment/growth , Physical Health , Special hobby/interest , Supportive family/friends , and Work skills     SUMMARY: Current SDOH Barriers:  No SDOH barriers identified.   Clinical Social Work Clinical Goal(s):  No clinical social work goals at this time  Interventions: Discussed common feeling and emotions when being diagnosed with cancer, and the importance of support during treatment Informed patient of the support team roles and support services at [facility] Provided CSW contact information and encouraged patient to call with any questions or concerns Provided patient with information about [employer].     Follow Up Plan: Patient will contact CSW with any support or resource needs and CSW will follow-up with patient by phone . Abigail Hicks / Spouse prefer to discuss financial concerns after treatmetn gets started.  Patient verbalizes understanding of plan: Yes  [name], LCSW Clinical Social Worker [facility]

## 2024-02-04 NOTE — Progress Notes (Signed)
 PATIENT NAVIGATOR PROGRESS NOTE  Name: Abigail Hicks Date: 02/04/2024 MRN: [id number]  DOB: 09/27/1975   Reason for visit:  Patient education session  Comments:  Attended patient ed session with husband, please see education note    Time spent counseling/coordinating care: > 60 minutes

## 2024-02-04 NOTE — Progress Notes (Signed)
 [facility] OFFICE PROGRESS NOTE   Diagnosis: Colon cancer  INTERVAL HISTORY:   Abigail Hicks returns for follow-up.  Bowels are moving.  She is eating a soft diet.  No nausea or vomiting.  She developed a pruritic rash at the low abdomen and buttocks 2 days ago.  Objective:  Vital signs in last 24 hours:  Blood pressure 124/82, pulse 78, temperature 97.8 F (36.6 C), temperature source Temporal, resp. rate 18, height 5' 8 (1.727 m), weight 145 lb 1.6 oz (65.8 kg), SpO2 100%.    HEENT: No thrush or ulcers. Resp: Lungs clear bilaterally. Cardio: Regular rate and rhythm. GI: Nontender.  No hepatosplenomegaly.  Slight fullness at the lower abdomen. Vascular: No leg edema. Neuro: Alert and oriented. Skin: Erythematous raised macular/papular rash right greater than left lower abdomen and right greater than left buttock.  Rash does not appear vesicular. Port-A-Cath without erythema.  Lab Results:  Lab Results  Component Value Date   WBC 7.5 02/04/2024   HGB 11.7 (L) 02/04/2024   HCT 37.0 02/04/2024   MCV 81.1 02/04/2024   PLT 521 (H) 02/04/2024   NEUTROABS 5.2 02/04/2024    Imaging:  No results found.  Medications: I have reviewed the patient's current medications.  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) 01/17/2024 CT abdomen/pelvis: Large abdominopelvic cystic mass, arising from the right ovary, ascites, right hydroureteronephrosis to the level of the pelvic mass, left ovary cysts 01/26/2024 ultrasound pelvis: Complex solid and cystic right adnexal mass measuring 18.2 x 9.7 x 14.3 cm, complex left ovarian cyst, moderate free fluid 01/26/2024 CT abdomen/pelvis: Sigmoid colon mass, adjacent pathologic adenopathy, peritoneal carcinomatosis, bilateral ovarian masses suspicious for metastases, 10 mm indeterminate right middle lobe nodule 01/27/2024 CT chest: 2.9 cm rounded consolidation in the posterior right lower lobe suspicious for  metastasis, scattered pulmonary nodules measuring up to 9 mm, indeterminate right paratracheal node, new trace right and small left pleural effusions, moderate ascites 01/28/2024 colonoscopy: Partially obstructing tumor at 14 cm - 20 cm proximal to the anus, biopsy-moderately differentiated adenocarcinoma, CDX2 positive, mismatch repair protein expression normal Elevated CEA, CA125 Cycle 1 FOLFOXIRI 02/05/2024, G-CSF   2.  Constipation and abdominal/pelvic distention secondary to #1 3.  Anemia/thrombocytosis secondary #1 4.  Asthma    Disposition: Abigail Hicks appears stable.  She was recently diagnosed with metastatic colon cancer.  Diagnosis, prognosis and treatment options reviewed with her and her husband at today's visit by Dr. Cloretta.  They understand no therapy will be curative.  Dr. Cloretta again reviewed systemic therapy with FOLFOXIRI.  We reviewed side effects associated with chemotherapy including bone marrow toxicity, nausea, hair loss, allergic reaction.  We discussed the various forms of neuropathy associated with oxaliplatin.  We discussed the hair loss and diarrhea associated with irinotecan.  We reviewed potential side effects associated with 5-fluorouracil including mouth sores, diarrhea, skin rash, hand-foot syndrome, increased sensitivity to sun, cardiotoxicity.  We discussed white cell growth factor support and potential side effects including bone pain, rash, splenic rupture.  She agrees to proceed with treatment as above with white cell growth factor support on day of pump discontinuation.  She will attend a chemotherapy education class today.  Foundation 1 is pending.  We will see her in follow-up in 1 week.  We are available to see her sooner if needed.  Patient seen with Dr. Cloretta.    Abigail Hicks ANP/GNP-BC   02/04/2024  12:03 PM  This was a visit with Abigail Hicks.  [name] was interviewed and examined.  She is been diagnosed with metastatic colon cancer.  We  discussed treatment options with Abigail Hicks and her husband.  She is not a candidate for immunotherapy since the mismatch repair protein expression returned normal.  Results from NGS testing are pending.  We discussed FOLFOX or FOLFIRI versus FOLFOXIRI with Abigail Hicks and her husband.  She understands the increased response rate and prolongation of disease-free survival associated with use of FOLFOXIRI.  She has a significant tumor burden and a good performance status.  She previously discussed FOLFOXIRI with Dr. Lanny.  I agree with the plan for FOLFOXIRI.  We reviewed potential toxicities associated with this regimen again today.  She will receive G-CSF support.  We will consider adding bevacizumab or an eGFR inhibitor cycle 2 pending the NGS testing.  She will be scheduled for an office visit during the week following chemotherapy.  We discussed goals of care.  She would like to remain on a full CODE STATUS.  A treatment plan was entered today.  The irinotecan will be dose reduced with cycle 1.  Arvella Hof, MD

## 2024-02-04 NOTE — Progress Notes (Signed)
 Pharmacist Chemotherapy Monitoring - Initial Assessment    Anticipated start date: 02/05/24   The following has been reviewed per standard work regarding the patient's treatment regimen: The patient's diagnosis, treatment plan and drug doses, and organ/hematologic function Lab orders and baseline tests specific to treatment regimen  The treatment plan start date, drug sequencing, and pre-medications Prior authorization status  Patient's documented medication list, including drug-drug interaction screen and prescriptions for anti-emetics and supportive care specific to the treatment regimen The drug concentrations, fluid compatibility, administration routes, and timing of the medications to be used The patient's access for treatment and lifetime cumulative dose history, if applicable  The patient's medication allergies and previous infusion related reactions, if applicable   Changes made to treatment plan:  N/A  Follow up needed:  N/A   Abigail Hicks, RPH, 02/04/2024  4:12 PM

## 2024-02-05 ENCOUNTER — Inpatient Hospital Stay

## 2024-02-05 VITALS — BP 109/63 | HR 76 | Temp 98.0°F | Resp 18

## 2024-02-05 DIAGNOSIS — C187 Malignant neoplasm of sigmoid colon: Secondary | ICD-10-CM

## 2024-02-05 DIAGNOSIS — R1900 Intra-abdominal and pelvic swelling, mass and lump, unspecified site: Secondary | ICD-10-CM | POA: Diagnosis not present

## 2024-02-05 MED ORDER — OXALIPLATIN CHEMO INJECTION 100 MG/20ML
85.0000 mg/m2 | Freq: Once | INTRAVENOUS | Status: AC
  Administered 2024-02-05: 150 mg via INTRAVENOUS
  Filled 2024-02-05: qty 20

## 2024-02-05 MED ORDER — LEUCOVORIN CALCIUM INJECTION 350 MG
200.0000 mg/m2 | Freq: Once | INTRAVENOUS | Status: AC
  Administered 2024-02-05: 356 mg via INTRAVENOUS
  Filled 2024-02-05: qty 17.8

## 2024-02-05 MED ORDER — ATROPINE SULFATE 1 MG/ML IV SOLN
0.5000 mg | Freq: Once | INTRAVENOUS | Status: AC | PRN
  Administered 2024-02-05: 0.5 mg via INTRAVENOUS
  Filled 2024-02-05: qty 1

## 2024-02-05 MED ORDER — SODIUM CHLORIDE 0.9 % IV SOLN
125.0000 mg/m2 | Freq: Once | INTRAVENOUS | Status: AC
  Administered 2024-02-05: 220 mg via INTRAVENOUS
  Filled 2024-02-05: qty 5

## 2024-02-05 MED ORDER — SODIUM CHLORIDE 0.9 % IV SOLN
2000.0000 mg/m2 | INTRAVENOUS | Status: DC
  Administered 2024-02-05: 3500 mg via INTRAVENOUS
  Filled 2024-02-05: qty 70

## 2024-02-05 MED ORDER — SODIUM CHLORIDE 0.9 % IV SOLN: INTRAVENOUS | Status: DC

## 2024-02-05 MED ORDER — APREPITANT 130 MG/18ML IV EMUL
130.0000 mg | Freq: Once | INTRAVENOUS | Status: AC
  Administered 2024-02-05: 130 mg via INTRAVENOUS
  Filled 2024-02-05: qty 18

## 2024-02-05 MED ORDER — PALONOSETRON HCL INJECTION 0.25 MG/5ML
0.2500 mg | Freq: Once | INTRAVENOUS | Status: AC
  Administered 2024-02-05: 0.25 mg via INTRAVENOUS
  Filled 2024-02-05: qty 5

## 2024-02-05 MED ORDER — DEXAMETHASONE SOD PHOSPHATE PF 10 MG/ML IJ SOLN
10.0000 mg | Freq: Once | INTRAMUSCULAR | Status: AC
  Administered 2024-02-05: 10 mg via INTRAVENOUS

## 2024-02-05 MED ORDER — DEXTROSE 5 % IV SOLN: INTRAVENOUS | Status: DC

## 2024-02-05 NOTE — Patient Instructions (Signed)
 [facility] - A DEPT OF [facility]  Discharge Instructions: Thank you for choosing [facility] to provide your oncology and hematology care.   If you have a lab appointment with the Cancer Center, please go directly to the Cancer Center and check in at the registration area.   Wear comfortable clothing and clothing appropriate for easy access to any Portacath or PICC line.   We strive to give you quality time with your provider. You may need to reschedule your appointment if you arrive late (15 or more minutes).  Arriving late affects you and other patients whose appointments are after yours.  Also, if you miss three or more appointments without notifying the office, you may be dismissed from the clinic at the provider's discretion.      For prescription refill requests, have your pharmacy contact our office and allow 72 hours for refills to be completed.    Today you received the following chemotherapy and/or immunotherapy agents irinotecan, oxaliplatin, leucovorin, and fluorouracil.       To help prevent nausea and vomiting after your treatment, we encourage you to take your nausea medication as directed.  BELOW ARE SYMPTOMS THAT SHOULD BE REPORTED IMMEDIATELY: *FEVER GREATER THAN 100.4 F (38 C) OR HIGHER *CHILLS OR SWEATING *NAUSEA AND VOMITING THAT IS NOT CONTROLLED WITH YOUR NAUSEA MEDICATION *UNUSUAL SHORTNESS OF BREATH *UNUSUAL BRUISING OR BLEEDING *URINARY PROBLEMS (pain or burning when urinating, or frequent urination) *BOWEL PROBLEMS (unusual diarrhea, constipation, pain near the anus) TENDERNESS IN MOUTH AND THROAT WITH OR WITHOUT PRESENCE OF ULCERS (sore throat, sores in mouth, or a toothache) UNUSUAL RASH, SWELLING OR PAIN  UNUSUAL VAGINAL DISCHARGE OR ITCHING   Items with * indicate a potential emergency and should be followed up as soon as possible or go to the Emergency Department if any problems should occur.  Please show  the CHEMOTHERAPY ALERT CARD or IMMUNOTHERAPY ALERT CARD at check-in to the Emergency Department and triage nurse.  Should you have questions after your visit or need to cancel or reschedule your appointment, please contact [facility] - A DEPT OF [facility]  Dept: [phone]  and follow the prompts.  Office hours are 8:00 a.m. to 4:30 p.m. Monday - Friday. Please note that voicemails left after 4:00 p.m. may not be returned until the following business day.  We are closed weekends and major holidays. You have access to a nurse at all times for urgent questions. Please call the main number to the clinic Dept: [phone] and follow the prompts.   For any non-urgent questions, you may also contact your provider using MyChart. We now offer e-Visits for anyone 85 and older to request care online for non-urgent symptoms. For details visit [email or web address].   Also download the MyChart app! Go to the app store, search MyChart, open the app, select [place], and log in with your MyChart username and password.

## 2024-02-05 NOTE — Progress Notes (Signed)
 Patient seen by Olam Ned NP today  Vitals are within treatment parameters:Yes   Labs are within treatment parameters: Yes   Treatment plan has been signed: Yes   Per physician team, Patient is ready for treatment and there are NO modifications to the treatment plan. Chemo is on 02/05/24

## 2024-02-06 ENCOUNTER — Inpatient Hospital Stay: Admitting: Gynecologic Oncology

## 2024-02-06 ENCOUNTER — Telehealth: Payer: Self-pay

## 2024-02-06 NOTE — Telephone Encounter (Signed)
 24 Hour Call Back Reached out to patient regarding first treatment. Patient states she is no doing fine with no complaints or side effects. Educated patient to call with any questions or concerns.

## 2024-02-07 ENCOUNTER — Inpatient Hospital Stay

## 2024-02-07 ENCOUNTER — Other Ambulatory Visit: Payer: Self-pay | Admitting: Nurse Practitioner

## 2024-02-07 VITALS — BP 111/73 | HR 88 | Temp 98.1°F | Resp 18

## 2024-02-07 DIAGNOSIS — C187 Malignant neoplasm of sigmoid colon: Secondary | ICD-10-CM

## 2024-02-07 DIAGNOSIS — R1900 Intra-abdominal and pelvic swelling, mass and lump, unspecified site: Secondary | ICD-10-CM | POA: Diagnosis not present

## 2024-02-07 MED ORDER — PEGFILGRASTIM-JMDB 6 MG/0.6ML ~~LOC~~ SOSY
6.0000 mg | PREFILLED_SYRINGE | Freq: Once | SUBCUTANEOUS | Status: AC
  Administered 2024-02-07: 6 mg via SUBCUTANEOUS
  Filled 2024-02-07: qty 0.6

## 2024-02-07 NOTE — Patient Instructions (Signed)

## 2024-02-07 NOTE — Progress Notes (Signed)
 Pt arrived for pump removal and still had 6.58ml of drug in the pump. She needs to wear the pump for 48 hours instead of the usual 46. Messaged provider via secure chat. The pump only had 4.73ml left by the time Dr. Cloretta responded. Received ok to bolus the remaining drug in the pump. Pt tolerated well.

## 2024-02-10 ENCOUNTER — Inpatient Hospital Stay: Attending: Gynecologic Oncology | Admitting: Nutrition

## 2024-02-10 DIAGNOSIS — R110 Nausea: Secondary | ICD-10-CM | POA: Insufficient documentation

## 2024-02-10 DIAGNOSIS — Z5189 Encounter for other specified aftercare: Secondary | ICD-10-CM | POA: Insufficient documentation

## 2024-02-10 DIAGNOSIS — C786 Secondary malignant neoplasm of retroperitoneum and peritoneum: Secondary | ICD-10-CM | POA: Insufficient documentation

## 2024-02-10 DIAGNOSIS — Z452 Encounter for adjustment and management of vascular access device: Secondary | ICD-10-CM | POA: Insufficient documentation

## 2024-02-10 DIAGNOSIS — R197 Diarrhea, unspecified: Secondary | ICD-10-CM | POA: Insufficient documentation

## 2024-02-10 DIAGNOSIS — C187 Malignant neoplasm of sigmoid colon: Secondary | ICD-10-CM | POA: Insufficient documentation

## 2024-02-10 DIAGNOSIS — R918 Other nonspecific abnormal finding of lung field: Secondary | ICD-10-CM | POA: Insufficient documentation

## 2024-02-10 DIAGNOSIS — J45909 Unspecified asthma, uncomplicated: Secondary | ICD-10-CM | POA: Insufficient documentation

## 2024-02-10 DIAGNOSIS — D630 Anemia in neoplastic disease: Secondary | ICD-10-CM | POA: Insufficient documentation

## 2024-02-10 DIAGNOSIS — D75838 Other thrombocytosis: Secondary | ICD-10-CM | POA: Insufficient documentation

## 2024-02-10 DIAGNOSIS — Z5111 Encounter for antineoplastic chemotherapy: Secondary | ICD-10-CM | POA: Insufficient documentation

## 2024-02-10 DIAGNOSIS — N83292 Other ovarian cyst, left side: Secondary | ICD-10-CM | POA: Insufficient documentation

## 2024-02-10 DIAGNOSIS — K5900 Constipation, unspecified: Secondary | ICD-10-CM | POA: Insufficient documentation

## 2024-02-10 NOTE — Progress Notes (Signed)
 48 yo female diagnosed with stage IV Colon cancer and followed by Dr. Cloretta. Receiving FOLFOXFIRI s/p 1st treatment on October 29.  PMH includes Vitamin D  deficiency, Asthma, and mixed HLD.  Medications include MVI, Zofran, Miralax, Compazine, Senokot  Labs reviewed.  Height: 5' 8 Weight: 145 pounds 1.6 oz UBW: 155 pounds BMI: 22.06  She had poor appetite and early satiety about 1 month prior to diagnosis. Reports she has lost about 15 pounds from usual weight. She has been following a soft diet. Reports constipation but this has improved with stool softeners. She has increased fatigue. Experiencing cold sensitivity and nausea since treatment.  Patient has been restrictive with her food choices. She was given a low fiber diet after discharge. Has avoided some foods in fear they will exacerbate constipation. Drinks 1 Ensure Max every day. Is very unhappy with bland flavorless foods.  Voices questions about multivitamin without folate and prebiotics/probiotics.  Nutrition diagnosis: Unintended weight loss related to cancer and associated treatments as evidenced by 6% loss in less than one month/clinically significant.   Intervention: Provided detailed information of foods allowed on low fiber diet and additional fact sheet. Encouraged to keep a journal and document adverse effects. Provided meal suggestions and snack suggestions. Change Ensure Max to Ensure Complete and consume once daily. Provided samples and coupons. Reviewed benefits of [employer] [facility] to help rebuild and protect the GI tract.   Answered questions. Provided contact information.  Monitoring, Evaluation, Goals: Tolerate adequate calories and protein to minimize loss of lean body mass.  Next Visit: To be scheduled as needed.

## 2024-02-11 ENCOUNTER — Encounter (HOSPITAL_COMMUNITY): Payer: Self-pay

## 2024-02-12 ENCOUNTER — Inpatient Hospital Stay

## 2024-02-12 ENCOUNTER — Inpatient Hospital Stay: Admitting: Nurse Practitioner

## 2024-02-12 ENCOUNTER — Other Ambulatory Visit: Payer: Self-pay | Admitting: Oncology

## 2024-02-12 ENCOUNTER — Encounter: Payer: Self-pay | Admitting: Nurse Practitioner

## 2024-02-12 VITALS — BP 111/79 | HR 89 | Temp 97.8°F | Resp 18 | Ht 68.0 in | Wt 138.8 lb

## 2024-02-12 DIAGNOSIS — K5900 Constipation, unspecified: Secondary | ICD-10-CM | POA: Diagnosis not present

## 2024-02-12 DIAGNOSIS — D75838 Other thrombocytosis: Secondary | ICD-10-CM | POA: Diagnosis not present

## 2024-02-12 DIAGNOSIS — D630 Anemia in neoplastic disease: Secondary | ICD-10-CM | POA: Diagnosis not present

## 2024-02-12 DIAGNOSIS — J45909 Unspecified asthma, uncomplicated: Secondary | ICD-10-CM | POA: Diagnosis not present

## 2024-02-12 DIAGNOSIS — C187 Malignant neoplasm of sigmoid colon: Secondary | ICD-10-CM

## 2024-02-12 DIAGNOSIS — C786 Secondary malignant neoplasm of retroperitoneum and peritoneum: Secondary | ICD-10-CM | POA: Diagnosis not present

## 2024-02-12 DIAGNOSIS — N83292 Other ovarian cyst, left side: Secondary | ICD-10-CM | POA: Diagnosis not present

## 2024-02-12 DIAGNOSIS — Z5189 Encounter for other specified aftercare: Secondary | ICD-10-CM | POA: Diagnosis not present

## 2024-02-12 DIAGNOSIS — R197 Diarrhea, unspecified: Secondary | ICD-10-CM | POA: Diagnosis not present

## 2024-02-12 DIAGNOSIS — R110 Nausea: Secondary | ICD-10-CM | POA: Diagnosis not present

## 2024-02-12 DIAGNOSIS — R918 Other nonspecific abnormal finding of lung field: Secondary | ICD-10-CM | POA: Diagnosis not present

## 2024-02-12 DIAGNOSIS — Z5111 Encounter for antineoplastic chemotherapy: Secondary | ICD-10-CM | POA: Diagnosis present

## 2024-02-12 DIAGNOSIS — Z452 Encounter for adjustment and management of vascular access device: Secondary | ICD-10-CM | POA: Diagnosis not present

## 2024-02-12 LAB — CBC WITH DIFFERENTIAL (CANCER CENTER ONLY)
Band Neutrophils: 10 %
Basophils Absolute: 0 K/uL (ref 0.0–0.1)
Basophils Relative: 0 %
Eosinophils Absolute: 0.3 K/uL (ref 0.0–0.5)
Eosinophils Relative: 2 %
HCT: 33.1 % — ABNORMAL LOW (ref 36.0–46.0)
Hemoglobin: 10.8 g/dL — ABNORMAL LOW (ref 12.0–15.0)
Lymphocytes Relative: 16 %
Lymphs Abs: 2.6 K/uL (ref 0.7–4.0)
MCH: 25.9 pg — ABNORMAL LOW (ref 26.0–34.0)
MCHC: 32.6 g/dL (ref 30.0–36.0)
MCV: 79.4 fL — ABNORMAL LOW (ref 80.0–100.0)
Monocytes Absolute: 0.7 K/uL (ref 0.1–1.0)
Monocytes Relative: 4 %
Neutro Abs: 12.9 K/uL — ABNORMAL HIGH (ref 1.7–7.7)
Neutrophils Relative %: 68 %
Platelet Count: 372 K/uL (ref 150–400)
RBC: 4.17 MIL/uL (ref 3.87–5.11)
RDW: 12.6 % (ref 11.5–15.5)
Smear Review: NORMAL
WBC Count: 16.5 K/uL — ABNORMAL HIGH (ref 4.0–10.5)
nRBC: 0 % (ref 0.0–0.2)

## 2024-02-12 LAB — CMP (CANCER CENTER ONLY)
ALT: 13 U/L (ref 0–44)
AST: 29 U/L (ref 15–41)
Albumin: 4.5 g/dL (ref 3.5–5.0)
Alkaline Phosphatase: 105 U/L (ref 38–126)
Anion gap: 11 (ref 5–15)
BUN: 12 mg/dL (ref 6–20)
CO2: 24 mmol/L (ref 22–32)
Calcium: 9.8 mg/dL (ref 8.9–10.3)
Chloride: 104 mmol/L (ref 98–111)
Creatinine: 0.63 mg/dL (ref 0.44–1.00)
GFR, Estimated: >60 mL/min (ref >60)
Glucose, Bld: 90 mg/dL (ref 70–99)
Potassium: 3.8 mmol/L (ref 3.5–5.1)
Sodium: 138 mmol/L (ref 135–145)
Total Bilirubin: <0.2 mg/dL (ref 0.0–1.2)
Total Protein: 7.6 g/dL (ref 6.5–8.1)

## 2024-02-12 LAB — MISC LABCORP TEST (SEND OUT)
LabCorp test name: 511200
Labcorp test code: 511200

## 2024-02-12 MED ORDER — SODIUM CHLORIDE 0.9 % IV SOLN: INTRAVENOUS | Status: AC

## 2024-02-12 NOTE — Patient Instructions (Signed)
 Dehydration, Adult Dehydration is a condition in which there is not enough water or other fluids in the body. This happens when a person loses more fluids than they take in. Important organs cannot work right without the right amount of fluids. Any loss of fluids from the body can cause dehydration. Dehydration can be mild, worse, or very bad. It should be treated right away to keep it from getting very bad. What are the causes? Conditions that cause loss of water in the body. They include: Watery poop (diarrhea). Vomiting. Sweating a lot. Fever. Infection. Peeing (urinating) a lot. Not drinking enough fluids. Certain medicines, such as medicines that take extra fluid out of the body (diuretics). Lack of safe drinking water. Not being able to get enough water and food. What increases the risk? Having a long-term (chronic) illness that has not been treated the right way, such as: Diabetes. Heart disease. Kidney disease. Being 25 years of age or older. Having a disability. Living in a place that is high above the ground or sea (high in altitude). The thinner, drier air causes more fluid loss. Doing exercises that put stress on your body for a long time. Being active when in hot places. What are the signs or symptoms? Symptoms of dehydration depend on how bad it is. Mild or worse dehydration Thirst. Dry lips or dry mouth. Feeling dizzy or light-headed. Muscle cramps. Passing little pee or dark pee. Pee may be the color of tea. Headache. Very bad dehydration Changes in skin. Skin may: Be cold to the touch (clammy). Be blotchy or pale. Not go back to normal right after you pinch it and let it go. Little or no tears, pee, or sweat. Fast breathing. Low blood pressure. Weak pulse. Pulse that is more than 100 beats a minute when you are sitting still. Other changes, such as: Feeling very thirsty. Eyes that look hollow (sunken). Cold hands and feet. Being confused. Being very  tired (lethargic) or having trouble waking from sleep. Losing weight. Loss of consciousness. How is this treated? Treatment for this condition depends on how bad your dehydration is. Treatment should start right away. Do not wait until your condition gets very bad. Very bad dehydration is an emergency. You will need to go to a hospital. Mild or worse dehydration can be treated at home. You may be asked to: Drink more fluids. Drink an oral rehydration solution (ORS). This drink gives you the right amount of fluids, salts, and minerals (electrolytes). Very bad dehydration can be treated: With fluids through an IV tube. By correcting low levels of electrolytes in the body. By treating the problem that caused your dehydration. Follow these instructions at home: Oral rehydration solution If told by your doctor, drink an ORS: Make an ORS. Use instructions on the package. Start by drinking small amounts, about  cup (120 mL) every 5-10 minutes. Slowly drink more until you have had the amount that your doctor said to have.  Eating and drinking  Drink enough clear fluid to keep your pee pale yellow. If you were told to drink an ORS, finish the ORS first. Then, start slowly drinking other clear fluids. Drink fluids such as: Water. Do not drink only water. Doing that can make the salt (sodium) level in your body get too low. Water from ice chips you suck on. Fruit juice that you have added water to (diluted). Low-calorie sports drinks. Eat foods that have the right amounts of salts and minerals, such as bananas, oranges, potatoes,  tomatoes, or spinach. Do not drink alcohol. Avoid drinks that have caffeine or sugar. These include:: High-calorie sports drinks. Fruit juice that you did not add water to. Soda. Coffee or energy drinks. Avoid foods that are greasy or have a lot of fat or sugar. General instructions Take over-the-counter and prescription medicines only as told by your doctor. Do  not take sodium tablets. Doing that can make the salt level in your body get too high. Return to your normal activities as told by your doctor. Ask your doctor what activities are safe for you. Keep all follow-up visits. Your doctor may check and change your treatment. Contact a doctor if: You have pain in your belly (abdomen) and the pain: Gets worse. Stays in one place. You have a rash. You have a stiff neck. You get angry or annoyed more easily than normal. You are more tired or have a harder time waking than normal. You feel weak or dizzy. You feel very thirsty. Get help right away if: You have any symptoms of very bad dehydration. You vomit every time you eat or drink. Your vomiting gets worse, does not go away, or you vomit blood or green stuff. You are getting treatment, but symptoms are getting worse. You have a fever. You have a very bad headache. You have: Diarrhea that gets worse or does not go away. Blood in your poop (stool). This may cause poop to look black and tarry. No pee in 6-8 hours. Only a small amount of pee in 6-8 hours, and the pee is very dark. You have trouble breathing. These symptoms may be an emergency. Get help right away. Call 911. Do not wait to see if the symptoms will go away. Do not drive yourself to the hospital. This information is not intended to replace advice given to you by your health care provider. Make sure you discuss any questions you have with your health care provider. Document Revised: 10/23/2021 Document Reviewed: 10/23/2021 Elsevier Patient Education  2024 [employer].

## 2024-02-12 NOTE — Progress Notes (Addendum)
  [facility] OFFICE PROGRESS NOTE   Diagnosis: Colon cancer  INTERVAL HISTORY:   Abigail Hicks returns as scheduled.  She completed cycle 1 FOLFOXIRI 02/05/2024.  She had mild intermittent nausea for about 3 days.  No vomiting.  Home medications effectively relieve the nausea.  No mouth sores.  She began having loose stools day 6 typically occurring after a meal.  She took a single Imodium.  Stool is now formed.  No significant cold sensitivity.  No bone pain after G-CSF.  Objective:  Vital signs in last 24 hours:  Blood pressure 111/79, pulse 89, temperature 97.8 F (36.6 C), temperature source Temporal, resp. rate 18, height 5' 8 (1.727 m), weight 138 lb 12.8 oz (63 kg), SpO2 100%.    HEENT: No thrush or ulcers. Resp: Lungs clear bilaterally. Cardio: Regular rate and rhythm. GI: Fullness across the low abdomen with associated tenderness.  No hepatomegaly. Vascular: No leg edema. Skin: Palms without erythema. Port-A-Cath without erythema.  Lab Results:  Lab Results  Component Value Date   WBC 16.5 (H) 02/12/2024   HGB 10.8 (L) 02/12/2024   HCT 33.1 (L) 02/12/2024   MCV 79.4 (L) 02/12/2024   PLT 372 02/12/2024   NEUTROABS PENDING 02/12/2024    Imaging:  No results found.  Medications: I have reviewed the patient's current medications.  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) 01/17/2024 CT abdomen/pelvis: Large abdominopelvic cystic mass, arising from the right ovary, ascites, right hydroureteronephrosis to the level of the pelvic mass, left ovary cysts 01/26/2024 ultrasound pelvis: Complex solid and cystic right adnexal mass measuring 18.2 x 9.7 x 14.3 cm, complex left ovarian cyst, moderate free fluid 01/26/2024 CT abdomen/pelvis: Sigmoid colon mass, adjacent pathologic adenopathy, peritoneal carcinomatosis, bilateral ovarian masses suspicious for metastases, 10 mm indeterminate right middle lobe nodule 01/27/2024 CT chest: 2.9 cm  rounded consolidation in the posterior right lower lobe suspicious for metastasis, scattered pulmonary nodules measuring up to 9 mm, indeterminate right paratracheal node, new trace right and small left pleural effusions, moderate ascites 01/28/2024 colonoscopy: Partially obstructing tumor at 14 cm - 20 cm proximal to the anus, biopsy-moderately differentiated adenocarcinoma, CDX2 positive, mismatch repair protein expression normal; foundation 1-microsatellite stable, tumor mutation burden 2, KRAS A59del Elevated CEA, CA125 Cycle 1 FOLFOXIRI 02/05/2024, G-CSF   2.  Constipation and abdominal/pelvic distention secondary to #1 3.  Anemia/thrombocytosis secondary #1 4.  Asthma  Disposition: Abigail Hicks appears stable.  She has completed 1 cycle of FOLFOXIRI.  She had mild nausea and mild diarrhea, otherwise tolerated well.  Home antiemetics were effective.  We reviewed the dosing instructions for Imodium.  We reviewed the CBC and chemistry panel.  Plan for liter of IV fluids today.  She will return for follow-up and cycle 2 FOLFOXIRI in 1 week.  We are available to see her sooner if needed.    Olam Ned ANP/GNP-BC   02/12/2024  11:29 AM

## 2024-02-12 NOTE — Patient Instructions (Signed)

## 2024-02-14 ENCOUNTER — Other Ambulatory Visit: Payer: Self-pay

## 2024-02-19 ENCOUNTER — Inpatient Hospital Stay: Admitting: Gynecologic Oncology

## 2024-02-19 ENCOUNTER — Inpatient Hospital Stay

## 2024-02-19 ENCOUNTER — Inpatient Hospital Stay (HOSPITAL_BASED_OUTPATIENT_CLINIC_OR_DEPARTMENT_OTHER): Admitting: Oncology

## 2024-02-19 VITALS — BP 105/74 | HR 82 | Temp 98.0°F | Resp 18 | Ht 68.0 in | Wt 145.0 lb

## 2024-02-19 VITALS — BP 108/65 | HR 68 | Temp 98.2°F | Resp 18

## 2024-02-19 DIAGNOSIS — C187 Malignant neoplasm of sigmoid colon: Secondary | ICD-10-CM

## 2024-02-19 DIAGNOSIS — Z5111 Encounter for antineoplastic chemotherapy: Secondary | ICD-10-CM | POA: Diagnosis not present

## 2024-02-19 LAB — CBC WITH DIFFERENTIAL (CANCER CENTER ONLY)
Abs Immature Granulocytes: 1.53 K/uL — ABNORMAL HIGH (ref 0.00–0.07)
Basophils Absolute: 0.1 K/uL (ref 0.0–0.1)
Basophils Relative: 1 %
Eosinophils Absolute: 0.3 K/uL (ref 0.0–0.5)
Eosinophils Relative: 2 %
HCT: 33.1 % — ABNORMAL LOW (ref 36.0–46.0)
Hemoglobin: 10.7 g/dL — ABNORMAL LOW (ref 12.0–15.0)
Immature Granulocytes: 13 %
Lymphocytes Relative: 20 %
Lymphs Abs: 2.4 K/uL (ref 0.7–4.0)
MCH: 26 pg (ref 26.0–34.0)
MCHC: 32.3 g/dL (ref 30.0–36.0)
MCV: 80.5 fL (ref 80.0–100.0)
Monocytes Absolute: 1.1 K/uL — ABNORMAL HIGH (ref 0.1–1.0)
Monocytes Relative: 10 %
Neutro Abs: 6.4 K/uL (ref 1.7–7.7)
Neutrophils Relative %: 54 %
Platelet Count: 290 K/uL (ref 150–400)
RBC: 4.11 MIL/uL (ref 3.87–5.11)
RDW: 13.7 % (ref 11.5–15.5)
WBC Count: 12.1 K/uL — ABNORMAL HIGH (ref 4.0–10.5)
nRBC: 0 % (ref 0.0–0.2)

## 2024-02-19 LAB — CMP (CANCER CENTER ONLY)
ALT: 14 U/L (ref 0–44)
AST: 23 U/L (ref 15–41)
Albumin: 4.2 g/dL (ref 3.5–5.0)
Alkaline Phosphatase: 81 U/L (ref 38–126)
Anion gap: 9 (ref 5–15)
BUN: 14 mg/dL (ref 6–20)
CO2: 27 mmol/L (ref 22–32)
Calcium: 9.9 mg/dL (ref 8.9–10.3)
Chloride: 104 mmol/L (ref 98–111)
Creatinine: 0.76 mg/dL (ref 0.44–1.00)
GFR, Estimated: >60 mL/min (ref >60)
Glucose, Bld: 102 mg/dL — ABNORMAL HIGH (ref 70–99)
Potassium: 3.9 mmol/L (ref 3.5–5.1)
Sodium: 139 mmol/L (ref 135–145)
Total Bilirubin: 0.2 mg/dL (ref 0.0–1.2)
Total Protein: 7.2 g/dL (ref 6.5–8.1)

## 2024-02-19 MED ORDER — DEXAMETHASONE SOD PHOSPHATE PF 10 MG/ML IJ SOLN
10.0000 mg | Freq: Once | INTRAMUSCULAR | Status: AC
  Administered 2024-02-19: 10 mg via INTRAVENOUS

## 2024-02-19 MED ORDER — DEXTROSE 5 % IV SOLN: INTRAVENOUS | Status: DC

## 2024-02-19 MED ORDER — SODIUM CHLORIDE 0.9 % IV SOLN
2000.0000 mg/m2 | INTRAVENOUS | Status: DC
  Administered 2024-02-19: 3500 mg via INTRAVENOUS
  Filled 2024-02-19: qty 70

## 2024-02-19 MED ORDER — APREPITANT 130 MG/18ML IV EMUL
130.0000 mg | Freq: Once | INTRAVENOUS | Status: AC
  Administered 2024-02-19: 130 mg via INTRAVENOUS
  Filled 2024-02-19: qty 18

## 2024-02-19 MED ORDER — OXALIPLATIN CHEMO INJECTION 100 MG/20ML
85.0000 mg/m2 | Freq: Once | INTRAVENOUS | Status: AC
  Administered 2024-02-19: 150 mg via INTRAVENOUS
  Filled 2024-02-19: qty 20

## 2024-02-19 MED ORDER — PALONOSETRON HCL INJECTION 0.25 MG/5ML
0.2500 mg | Freq: Once | INTRAVENOUS | Status: AC
  Administered 2024-02-19: 0.25 mg via INTRAVENOUS
  Filled 2024-02-19: qty 5

## 2024-02-19 MED ORDER — SODIUM CHLORIDE 0.9 % IV SOLN: INTRAVENOUS | Status: DC

## 2024-02-19 MED ORDER — ATROPINE SULFATE 1 MG/ML IV SOLN
0.5000 mg | Freq: Once | INTRAVENOUS | Status: AC | PRN
  Administered 2024-02-19: 0.5 mg via INTRAVENOUS
  Filled 2024-02-19: qty 1

## 2024-02-19 MED ORDER — SODIUM CHLORIDE 0.9 % IV SOLN
125.0000 mg/m2 | Freq: Once | INTRAVENOUS | Status: AC
  Administered 2024-02-19: 220 mg via INTRAVENOUS
  Filled 2024-02-19: qty 5

## 2024-02-19 MED ORDER — LEUCOVORIN CALCIUM INJECTION 350 MG
200.0000 mg/m2 | Freq: Once | INTRAVENOUS | Status: AC
  Administered 2024-02-19: 356 mg via INTRAVENOUS
  Filled 2024-02-19: qty 17.8

## 2024-02-19 NOTE — Patient Instructions (Signed)
 [facility] - A DEPT OF [facility]  Discharge Instructions: Thank you for choosing [facility] to provide your oncology and hematology care.   If you have a lab appointment with the Cancer Center, please go directly to the Cancer Center and check in at the registration area.   Wear comfortable clothing and clothing appropriate for easy access to any Portacath or PICC line.   We strive to give you quality time with your provider. You may need to reschedule your appointment if you arrive late (15 or more minutes).  Arriving late affects you and other patients whose appointments are after yours.  Also, if you miss three or more appointments without notifying the office, you may be dismissed from the clinic at the provider's discretion.      For prescription refill requests, have your pharmacy contact our office and allow 72 hours for refills to be completed.    Today you received the following chemotherapy and/or immunotherapy agents irinotecan, oxaliplatin, leucovorin, and fluorouracil.       To help prevent nausea and vomiting after your treatment, we encourage you to take your nausea medication as directed.  BELOW ARE SYMPTOMS THAT SHOULD BE REPORTED IMMEDIATELY: *FEVER GREATER THAN 100.4 F (38 C) OR HIGHER *CHILLS OR SWEATING *NAUSEA AND VOMITING THAT IS NOT CONTROLLED WITH YOUR NAUSEA MEDICATION *UNUSUAL SHORTNESS OF BREATH *UNUSUAL BRUISING OR BLEEDING *URINARY PROBLEMS (pain or burning when urinating, or frequent urination) *BOWEL PROBLEMS (unusual diarrhea, constipation, pain near the anus) TENDERNESS IN MOUTH AND THROAT WITH OR WITHOUT PRESENCE OF ULCERS (sore throat, sores in mouth, or a toothache) UNUSUAL RASH, SWELLING OR PAIN  UNUSUAL VAGINAL DISCHARGE OR ITCHING   Items with * indicate a potential emergency and should be followed up as soon as possible or go to the Emergency Department if any problems should occur.  Please show  the CHEMOTHERAPY ALERT CARD or IMMUNOTHERAPY ALERT CARD at check-in to the Emergency Department and triage nurse.  Should you have questions after your visit or need to cancel or reschedule your appointment, please contact [facility] - A DEPT OF [facility]  Dept: [phone]  and follow the prompts.  Office hours are 8:00 a.m. to 4:30 p.m. Monday - Friday. Please note that voicemails left after 4:00 p.m. may not be returned until the following business day.  We are closed weekends and major holidays. You have access to a nurse at all times for urgent questions. Please call the main number to the clinic Dept: [phone] and follow the prompts.   For any non-urgent questions, you may also contact your provider using MyChart. We now offer e-Visits for anyone 85 and older to request care online for non-urgent symptoms. For details visit [email or web address].   Also download the MyChart app! Go to the app store, search MyChart, open the app, select [place], and log in with your MyChart username and password.

## 2024-02-19 NOTE — Progress Notes (Signed)
 [facility] OFFICE PROGRESS NOTE   Diagnosis: Colon cancer  INTERVAL HISTORY:   [name].  Cycle 1 FOLFOXIRI on 02/05/2024.  No nausea/vomiting or mouth sores.  She had cold sensitivity for 1 day following chemotherapy.  No neuropathy symptoms at present.  She reports diarrhea beginning approximately 5 days following chemotherapy.  The diarrhea lasted 4-5 days and occurred after eating.  She took Imodium once daily.  She is having bowel movements.  She reports a good appetite.  Objective:  Vital signs in last 24 hours:  Blood pressure 105/74, pulse 82, temperature 98 F (36.7 C), temperature source Temporal, resp. rate 18, height 5' 8 (1.727 m), weight 145 lb (65.8 kg), SpO2 98%.    HEENT: No thrush or ulcers Resp: Lungs clear bilaterally Cardio: Regular rate and rhythm GI: No hepatosplenomegaly, mild tenderness in the low abdomen, no mass Vascular: No leg edema  Skin: Palms without erythema, resolving erythematous/hyperpigmented rash at the left low abdomen/upper thigh  Portacath/PICC-without erythema  Lab Results:  Lab Results  Component Value Date   WBC 12.1 (H) 02/19/2024   HGB 10.7 (L) 02/19/2024   HCT 33.1 (L) 02/19/2024   MCV 80.5 02/19/2024   PLT 290 02/19/2024   NEUTROABS PENDING 02/19/2024    CMP  Lab Results  Component Value Date   NA 138 02/12/2024   K 3.8 02/12/2024   CL 104 02/12/2024   CO2 24 02/12/2024   GLUCOSE 90 02/12/2024   BUN 12 02/12/2024   CREATININE 0.63 02/12/2024   CALCIUM 9.8 02/12/2024   PROT 7.6 02/12/2024   ALBUMIN 4.5 02/12/2024   AST 29 02/12/2024   ALT 13 02/12/2024   ALKPHOS 105 02/12/2024   BILITOT <0.2 02/12/2024   GFRNONAA >60 02/12/2024   GFRAA 109 04/09/2019    Lab Results  Component Value Date   CEA 15.46 (H) 02/04/2024    Medications: I have reviewed the patient's current medications.   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) 01/17/2024 CT abdomen/pelvis: Large  abdominopelvic cystic mass, arising from the right ovary, ascites, right hydroureteronephrosis to the level of the pelvic mass, left ovary cysts 01/26/2024 ultrasound pelvis: Complex solid and cystic right adnexal mass measuring 18.2 x 9.7 x 14.3 cm, complex left ovarian cyst, moderate free fluid 01/26/2024 CT abdomen/pelvis: Sigmoid colon mass, adjacent pathologic adenopathy, peritoneal carcinomatosis, bilateral ovarian masses suspicious for metastases, 10 mm indeterminate right middle lobe nodule 01/27/2024 CT chest: 2.9 cm rounded consolidation in the posterior right lower lobe suspicious for metastasis, scattered pulmonary nodules measuring up to 9 mm, indeterminate right paratracheal node, new trace right and small left pleural effusions, moderate ascites 01/28/2024 colonoscopy: Partially obstructing tumor at 14 cm - 20 cm proximal to the anus, biopsy-moderately differentiated adenocarcinoma, CDX2 positive, mismatch repair protein expression normal; foundation 1-microsatellite stable, tumor mutation burden 2, KRAS A59del, PTEN loss Elevated CEA, CA125 Cycle 1 FOLFOXIRI 02/05/2024, G-CSF Cycle 2 FOLFOXIRI 02/19/2024, G-CSF   2.  Constipation and abdominal/pelvic distention secondary to #1 3.  Anemia/thrombocytosis secondary #1 4.  Asthma    Disposition: [name] tolerated the first cycle of FOLFOXIRI well.  She will complete cycle 2 today.  We reviewed results of molecular testing.  The tumor has a K-ras mutation.  She will not be a candidate for an eGFR inhibitor.  We discussed the addition of bevacizumab to the chemotherapy regimen.  We reviewed potential toxicities associated with bevacizumab.  She declines bevacizumab.  [name] will return for an office visit and chemotherapy  on 03/11/2024.  We will check the CEA when she is here on 03/31/2024.  The plan is to complete 5 cycles of FOLFOXIRI prior to a restaging CT evaluation.  Arley Hof, MD  02/19/2024  8:42 AM

## 2024-02-19 NOTE — Progress Notes (Signed)
 Patient seen by Dr. Arley Hof today  Vitals are within treatment parameters:Yes   Labs are within treatment parameters: Yes   Treatment plan has been signed: Yes   Per physician team, Patient is ready for treatment. Please note the following modifications: MD has removed Avastin from treatment.

## 2024-02-21 ENCOUNTER — Other Ambulatory Visit: Payer: Self-pay

## 2024-02-21 ENCOUNTER — Inpatient Hospital Stay

## 2024-02-21 VITALS — BP 113/73 | HR 74 | Temp 98.1°F | Resp 18

## 2024-02-21 DIAGNOSIS — C187 Malignant neoplasm of sigmoid colon: Secondary | ICD-10-CM

## 2024-02-21 DIAGNOSIS — Z5111 Encounter for antineoplastic chemotherapy: Secondary | ICD-10-CM | POA: Diagnosis not present

## 2024-02-21 MED ORDER — PEGFILGRASTIM-JMDB 6 MG/0.6ML ~~LOC~~ SOSY
6.0000 mg | PREFILLED_SYRINGE | Freq: Once | SUBCUTANEOUS | Status: AC
  Administered 2024-02-21: 6 mg via SUBCUTANEOUS
  Filled 2024-02-21: qty 0.6

## 2024-02-21 NOTE — Patient Instructions (Signed)

## 2024-02-21 NOTE — Progress Notes (Signed)
 Pt arrived to pump de-access appointment. Pt complaining of constant sharp stabbing 7/10 pain in R chest wall port with RUE weakness during her 48 hr pump infusion treatment. Port flushed with no resistance and blood return noted. No pain during flush. Pt stated immediate relief upon port de-access. Site was clean, dry, and intact. No redness or drainage noted. Pt educated to call or direct message oncology team should this occur again or if she notices any changes in the following days.

## 2024-02-28 ENCOUNTER — Inpatient Hospital Stay: Admitting: Gynecologic Oncology

## 2024-03-06 ENCOUNTER — Other Ambulatory Visit: Payer: Self-pay | Admitting: Oncology

## 2024-03-06 DIAGNOSIS — C187 Malignant neoplasm of sigmoid colon: Secondary | ICD-10-CM

## 2024-03-11 ENCOUNTER — Inpatient Hospital Stay: Attending: Gynecologic Oncology | Admitting: Oncology

## 2024-03-11 ENCOUNTER — Inpatient Hospital Stay: Attending: Gynecologic Oncology

## 2024-03-11 ENCOUNTER — Inpatient Hospital Stay

## 2024-03-11 ENCOUNTER — Other Ambulatory Visit (HOSPITAL_BASED_OUTPATIENT_CLINIC_OR_DEPARTMENT_OTHER): Payer: Self-pay

## 2024-03-11 ENCOUNTER — Ambulatory Visit: Payer: Self-pay | Admitting: Oncology

## 2024-03-11 VITALS — BP 104/72 | HR 100 | Temp 97.8°F | Resp 18 | Ht 68.0 in | Wt 146.1 lb

## 2024-03-11 VITALS — BP 106/48 | HR 78 | Temp 97.9°F | Resp 18

## 2024-03-11 DIAGNOSIS — C187 Malignant neoplasm of sigmoid colon: Secondary | ICD-10-CM

## 2024-03-11 DIAGNOSIS — K5900 Constipation, unspecified: Secondary | ICD-10-CM | POA: Insufficient documentation

## 2024-03-11 DIAGNOSIS — Z5111 Encounter for antineoplastic chemotherapy: Secondary | ICD-10-CM | POA: Diagnosis present

## 2024-03-11 DIAGNOSIS — J45909 Unspecified asthma, uncomplicated: Secondary | ICD-10-CM | POA: Diagnosis not present

## 2024-03-11 DIAGNOSIS — C786 Secondary malignant neoplasm of retroperitoneum and peritoneum: Secondary | ICD-10-CM | POA: Insufficient documentation

## 2024-03-11 DIAGNOSIS — Z5189 Encounter for other specified aftercare: Secondary | ICD-10-CM | POA: Insufficient documentation

## 2024-03-11 DIAGNOSIS — D630 Anemia in neoplastic disease: Secondary | ICD-10-CM | POA: Diagnosis not present

## 2024-03-11 DIAGNOSIS — D75838 Other thrombocytosis: Secondary | ICD-10-CM | POA: Insufficient documentation

## 2024-03-11 DIAGNOSIS — Z452 Encounter for adjustment and management of vascular access device: Secondary | ICD-10-CM | POA: Insufficient documentation

## 2024-03-11 LAB — CBC WITH DIFFERENTIAL (CANCER CENTER ONLY)
Abs Immature Granulocytes: 0.16 K/uL — ABNORMAL HIGH (ref 0.00–0.07)
Basophils Absolute: 0.1 K/uL (ref 0.0–0.1)
Basophils Relative: 1 %
Eosinophils Absolute: 0 K/uL (ref 0.0–0.5)
Eosinophils Relative: 1 %
HCT: 32.4 % — ABNORMAL LOW (ref 36.0–46.0)
Hemoglobin: 10.5 g/dL — ABNORMAL LOW (ref 12.0–15.0)
Immature Granulocytes: 2 %
Lymphocytes Relative: 24 %
Lymphs Abs: 2 K/uL (ref 0.7–4.0)
MCH: 26 pg (ref 26.0–34.0)
MCHC: 32.4 g/dL (ref 30.0–36.0)
MCV: 80.2 fL (ref 80.0–100.0)
Monocytes Absolute: 0.9 K/uL (ref 0.1–1.0)
Monocytes Relative: 10 %
Neutro Abs: 5.3 K/uL (ref 1.7–7.7)
Neutrophils Relative %: 62 %
Platelet Count: 320 K/uL (ref 150–400)
RBC: 4.04 MIL/uL (ref 3.87–5.11)
RDW: 17 % — ABNORMAL HIGH (ref 11.5–15.5)
WBC Count: 8.4 K/uL (ref 4.0–10.5)
nRBC: 0 % (ref 0.0–0.2)

## 2024-03-11 LAB — CMP (CANCER CENTER ONLY)
ALT: 37 U/L (ref 0–44)
AST: 26 U/L (ref 15–41)
Albumin: 4.1 g/dL (ref 3.5–5.0)
Alkaline Phosphatase: 58 U/L (ref 38–126)
Anion gap: 10 (ref 5–15)
BUN: 15 mg/dL (ref 6–20)
CO2: 23 mmol/L (ref 22–32)
Calcium: 9.4 mg/dL (ref 8.9–10.3)
Chloride: 105 mmol/L (ref 98–111)
Creatinine: 0.65 mg/dL (ref 0.44–1.00)
GFR, Estimated: >60 mL/min (ref >60)
Glucose, Bld: 117 mg/dL — ABNORMAL HIGH (ref 70–99)
Potassium: 3.9 mmol/L (ref 3.5–5.1)
Sodium: 137 mmol/L (ref 135–145)
Total Bilirubin: 0.4 mg/dL (ref 0.0–1.2)
Total Protein: 7 g/dL (ref 6.5–8.1)

## 2024-03-11 LAB — CEA (ACCESS): CEA (CHCC): 6.08 ng/mL — ABNORMAL HIGH (ref 0.00–5.00)

## 2024-03-11 MED ORDER — SODIUM CHLORIDE 0.9 % IV SOLN
2000.0000 mg/m2 | INTRAVENOUS | Status: DC
  Administered 2024-03-11: 3500 mg via INTRAVENOUS
  Filled 2024-03-11: qty 20

## 2024-03-11 MED ORDER — DEXAMETHASONE 4 MG PO TABS
4.0000 mg | ORAL_TABLET | Freq: Two times a day (BID) | ORAL | 0 refills | Status: DC
  Filled 2024-03-11: qty 36, 18d supply, fill #0

## 2024-03-11 MED ORDER — SODIUM CHLORIDE 0.9 % IV SOLN: INTRAVENOUS | Status: DC

## 2024-03-11 MED ORDER — OXALIPLATIN CHEMO INJECTION 100 MG/20ML
85.0000 mg/m2 | Freq: Once | INTRAVENOUS | Status: AC
  Administered 2024-03-11: 150 mg via INTRAVENOUS
  Filled 2024-03-11: qty 20

## 2024-03-11 MED ORDER — DEXAMETHASONE SOD PHOSPHATE PF 10 MG/ML IJ SOLN
10.0000 mg | Freq: Once | INTRAMUSCULAR | Status: AC
  Administered 2024-03-11: 10 mg via INTRAVENOUS

## 2024-03-11 MED ORDER — SODIUM CHLORIDE 0.9 % IV SOLN
125.0000 mg/m2 | Freq: Once | INTRAVENOUS | Status: AC
  Administered 2024-03-11: 220 mg via INTRAVENOUS
  Filled 2024-03-11: qty 5

## 2024-03-11 MED ORDER — PROCHLORPERAZINE MALEATE 10 MG PO TABS
10.0000 mg | ORAL_TABLET | Freq: Four times a day (QID) | ORAL | 1 refills | Status: AC | PRN
  Filled 2024-03-11: qty 60, 15d supply, fill #0

## 2024-03-11 MED ORDER — ATROPINE SULFATE 1 MG/ML IV SOLN
0.5000 mg | Freq: Once | INTRAVENOUS | Status: AC | PRN
  Administered 2024-03-11: 0.5 mg via INTRAVENOUS
  Filled 2024-03-11: qty 1

## 2024-03-11 MED ORDER — APREPITANT 130 MG/18ML IV EMUL
130.0000 mg | Freq: Once | INTRAVENOUS | Status: AC
  Administered 2024-03-11: 130 mg via INTRAVENOUS
  Filled 2024-03-11: qty 18

## 2024-03-11 MED ORDER — DEXTROSE 5 % IV SOLN: INTRAVENOUS | Status: DC

## 2024-03-11 MED ORDER — PALONOSETRON HCL INJECTION 0.25 MG/5ML
0.2500 mg | Freq: Once | INTRAVENOUS | Status: AC
  Administered 2024-03-11: 0.25 mg via INTRAVENOUS
  Filled 2024-03-11: qty 5

## 2024-03-11 MED ORDER — LEUCOVORIN CALCIUM INJECTION 350 MG
200.0000 mg/m2 | Freq: Once | INTRAVENOUS | Status: AC
  Administered 2024-03-11: 356 mg via INTRAVENOUS
  Filled 2024-03-11: qty 17.8

## 2024-03-11 NOTE — Progress Notes (Signed)
 Patient seen by Dr. Arley Hof today  Vitals are within treatment parameters:Yes OK to proceed w/pulse 100  Labs are within treatment parameters: Yes  UA protein not needed (no Avastin)  Treatment plan has been signed: Yes   Per physician team, Patient is ready for treatment and there are NO modifications to the treatment plan.

## 2024-03-11 NOTE — Patient Instructions (Signed)
 [facility] - A DEPT OF [facility]  Discharge Instructions: Thank you for choosing [facility] to provide your oncology and hematology care.   If you have a lab appointment with the Cancer Center, please go directly to the Cancer Center and check in at the registration area.   Wear comfortable clothing and clothing appropriate for easy access to any Portacath or PICC line.   We strive to give you quality time with your provider. You may need to reschedule your appointment if you arrive late (15 or more minutes).  Arriving late affects you and other patients whose appointments are after yours.  Also, if you miss three or more appointments without notifying the office, you may be dismissed from the clinic at the provider's discretion.      For prescription refill requests, have your pharmacy contact our office and allow 72 hours for refills to be completed.    Today you received the following chemotherapy and/or immunotherapy agents: irinotecan , oxaliplatin , leucovorin , fluorouracil        To help prevent nausea and vomiting after your treatment, we encourage you to take your nausea medication as directed.  BELOW ARE SYMPTOMS THAT SHOULD BE REPORTED IMMEDIATELY: *FEVER GREATER THAN 100.4 F (38 C) OR HIGHER *CHILLS OR SWEATING *NAUSEA AND VOMITING THAT IS NOT CONTROLLED WITH YOUR NAUSEA MEDICATION *UNUSUAL SHORTNESS OF BREATH *UNUSUAL BRUISING OR BLEEDING *URINARY PROBLEMS (pain or burning when urinating, or frequent urination) *BOWEL PROBLEMS (unusual diarrhea, constipation, pain near the anus) TENDERNESS IN MOUTH AND THROAT WITH OR WITHOUT PRESENCE OF ULCERS (sore throat, sores in mouth, or a toothache) UNUSUAL RASH, SWELLING OR PAIN  UNUSUAL VAGINAL DISCHARGE OR ITCHING   Items with * indicate a potential emergency and should be followed up as soon as possible or go to the Emergency Department if any problems should occur.  Please show the  CHEMOTHERAPY ALERT CARD or IMMUNOTHERAPY ALERT CARD at check-in to the Emergency Department and triage nurse.  Should you have questions after your visit or need to cancel or reschedule your appointment, please contact [facility] - A DEPT OF [facility]  Dept: [phone]  and follow the prompts.  Office hours are 8:00 a.m. to 4:30 p.m. Monday - Friday. Please note that voicemails left after 4:00 p.m. may not be returned until the following business day.  We are closed weekends and major holidays. You have access to a nurse at all times for urgent questions. Please call the main number to the clinic Dept: [phone] and follow the prompts.   For any non-urgent questions, you may also contact your provider using MyChart. We now offer e-Visits for anyone 80 and older to request care online for non-urgent symptoms. For details visit [email or web address].   Also download the MyChart app! Go to the app store, search MyChart, open the app, select [facility], and log in with your MyChart username and password.

## 2024-03-11 NOTE — Progress Notes (Signed)
 Notified Ms. Brock of decrease in tumor marker CEA.

## 2024-03-11 NOTE — Progress Notes (Signed)
 [facility] OFFICE PROGRESS NOTE   Diagnosis: Colon cancer  INTERVAL HISTORY:   Abigail Hicks completed cycle 2 FOLFOXIRI on 02/19/2024.  She had nausea with 2 episodes of vomiting on days 6 and 7.  No other nausea.  She also had malaise beginning on day 5.  She feels well at present.  She reports discomfort at the Port-A-Cath site when it was accessed.  No fever or erythema.  No mouth sores or diarrhea.  She had cold sensitivity following chemotherapy.  No neuropathy symptoms at present.  She is having bowel movements.  No bleeding.  Objective:  Vital signs in last 24 hours:  Blood pressure 104/72, pulse 100, temperature 97.8 F (36.6 C), temperature source Temporal, resp. rate 18, height 5' 8 (1.727 m), weight 146 lb 1.6 oz (66.3 kg), SpO2 100%.    HEENT: No thrush or ulcers Resp: Lungs clear bilaterally Cardio: Regular rate and rhythm GI: Soft, nontender, no mass, no hepatosplenomegaly Vascular: No leg edema  Skin: Palms without erythema  Portacath/PICC-without erythema  Lab Results:  Lab Results  Component Value Date   WBC 8.4 03/11/2024   HGB 10.5 (L) 03/11/2024   HCT 32.4 (L) 03/11/2024   MCV 80.2 03/11/2024   PLT 320 03/11/2024   NEUTROABS 5.3 03/11/2024    CMP  Lab Results  Component Value Date   NA 139 02/19/2024   K 3.9 02/19/2024   CL 104 02/19/2024   CO2 27 02/19/2024   GLUCOSE 102 (H) 02/19/2024   BUN 14 02/19/2024   CREATININE 0.76 02/19/2024   CALCIUM  9.9 02/19/2024   PROT 7.2 02/19/2024   ALBUMIN 4.2 02/19/2024   AST 23 02/19/2024   ALT 14 02/19/2024   ALKPHOS 81 02/19/2024   BILITOT 0.2 02/19/2024   GFRNONAA >60 02/19/2024   GFRAA 109 04/09/2019    Lab Results  Component Value Date   CEA 15.46 (H) 02/04/2024    No results found for: INR, LABPROT  Imaging:  No results found.  Medications: I have reviewed the patient's current medications.   Assessment/Plan: Colon cancer, sigmoid, stage IV  (cTx,cN1,cM1) 01/17/2024 CT abdomen/pelvis: Large abdominopelvic cystic mass, arising from the right ovary, ascites, right hydroureteronephrosis to the level of the pelvic mass, left ovary cysts 01/26/2024 ultrasound pelvis: Complex solid and cystic right adnexal mass measuring 18.2 x 9.7 x 14.3 cm, complex left ovarian cyst, moderate free fluid 01/26/2024 CT abdomen/pelvis: Sigmoid colon mass, adjacent pathologic adenopathy, peritoneal carcinomatosis, bilateral ovarian masses suspicious for metastases, 10 mm indeterminate right middle lobe nodule 01/27/2024 CT chest: 2.9 cm rounded consolidation in the posterior right lower lobe suspicious for metastasis, scattered pulmonary nodules measuring up to 9 mm, indeterminate right paratracheal node, new trace right and small left pleural effusions, moderate ascites 01/28/2024 colonoscopy: Partially obstructing tumor at 14 cm - 20 cm proximal to the anus, biopsy-moderately differentiated adenocarcinoma, CDX2 positive, mismatch repair protein expression normal; foundation 1-microsatellite stable, tumor mutation burden 2, KRAS A59del, PTEN loss Elevated CEA, CA125 Cycle 1 FOLFOXIRI 02/05/2024, G-CSF Cycle 2 FOLFOXIRI 02/19/2024, G-CSF Cycle 3 FOLFOXIRI 03/11/2024, G-CSF, Decadron  added for delayed nausea   2.  Constipation and abdominal/pelvic distention secondary to #1 3.  Anemia/thrombocytosis secondary #1 4.  Asthma     Disposition: [name] has metastatic colon cancer.  She has completed 2 cycles of FOLFOXIRI.  She has tolerated the chemotherapy well aside from delayed nausea.  She felt better when she received IV fluids on day 8 following cycle 1.  She will be  scheduled for IV fluids on day 8 following this cycle.  We will add Decadron  for 3 days beginning on day 5.  She will contact us  if she has nausea despite the Decadron .  Ms. Shaft will complete cycle 3 FOLFOXIRI today.  She will return for an office visit and chemotherapy in 2 weeks.  We will  follow-up on the CEA from today. She confirmed her decision against bevacizumab therapy.  Arley Hof, MD  03/11/2024  9:55 AM

## 2024-03-11 NOTE — Patient Instructions (Signed)

## 2024-03-12 ENCOUNTER — Other Ambulatory Visit: Payer: Self-pay

## 2024-03-13 ENCOUNTER — Inpatient Hospital Stay

## 2024-03-13 VITALS — BP 119/70 | HR 72 | Temp 98.0°F | Resp 18

## 2024-03-13 DIAGNOSIS — C187 Malignant neoplasm of sigmoid colon: Secondary | ICD-10-CM

## 2024-03-13 DIAGNOSIS — Z5111 Encounter for antineoplastic chemotherapy: Secondary | ICD-10-CM | POA: Diagnosis not present

## 2024-03-13 MED ORDER — PEGFILGRASTIM-JMDB 6 MG/0.6ML ~~LOC~~ SOSY
6.0000 mg | PREFILLED_SYRINGE | Freq: Once | SUBCUTANEOUS | Status: AC
  Administered 2024-03-13: 6 mg via SUBCUTANEOUS
  Filled 2024-03-13: qty 0.6

## 2024-03-13 NOTE — Patient Instructions (Signed)

## 2024-03-16 ENCOUNTER — Telehealth: Payer: Self-pay

## 2024-03-16 NOTE — Telephone Encounter (Signed)
 Telephone call to patient about appointment for IVF on Wednesday 03/18/2024. Patient was agreeable to moving time form 0915 to 0815.

## 2024-03-17 ENCOUNTER — Encounter: Payer: Self-pay | Admitting: Oncology

## 2024-03-18 ENCOUNTER — Inpatient Hospital Stay

## 2024-03-18 ENCOUNTER — Other Ambulatory Visit: Payer: Self-pay | Admitting: *Deleted

## 2024-03-18 DIAGNOSIS — C187 Malignant neoplasm of sigmoid colon: Secondary | ICD-10-CM

## 2024-03-18 DIAGNOSIS — Z5111 Encounter for antineoplastic chemotherapy: Secondary | ICD-10-CM | POA: Diagnosis not present

## 2024-03-18 MED ORDER — SODIUM CHLORIDE 0.9 % IV SOLN: INTRAVENOUS | Status: AC

## 2024-03-18 NOTE — Patient Instructions (Signed)

## 2024-03-20 ENCOUNTER — Encounter: Payer: Self-pay | Admitting: Oncology

## 2024-03-20 ENCOUNTER — Other Ambulatory Visit (HOSPITAL_BASED_OUTPATIENT_CLINIC_OR_DEPARTMENT_OTHER): Payer: Self-pay

## 2024-03-20 ENCOUNTER — Other Ambulatory Visit (HOSPITAL_COMMUNITY): Payer: Self-pay

## 2024-03-21 ENCOUNTER — Other Ambulatory Visit: Payer: Self-pay | Admitting: Oncology

## 2024-03-21 DIAGNOSIS — C187 Malignant neoplasm of sigmoid colon: Secondary | ICD-10-CM

## 2024-03-23 ENCOUNTER — Other Ambulatory Visit (HOSPITAL_COMMUNITY): Payer: Self-pay

## 2024-03-25 ENCOUNTER — Inpatient Hospital Stay

## 2024-03-25 ENCOUNTER — Encounter: Payer: Self-pay | Admitting: Nurse Practitioner

## 2024-03-25 ENCOUNTER — Inpatient Hospital Stay: Admitting: Nurse Practitioner

## 2024-03-25 VITALS — BP 102/73 | HR 72 | Temp 98.1°F | Resp 18 | Ht 68.0 in | Wt 147.8 lb

## 2024-03-25 VITALS — BP 115/66 | HR 72 | Temp 98.2°F | Resp 16

## 2024-03-25 DIAGNOSIS — C187 Malignant neoplasm of sigmoid colon: Secondary | ICD-10-CM

## 2024-03-25 DIAGNOSIS — Z5111 Encounter for antineoplastic chemotherapy: Secondary | ICD-10-CM | POA: Diagnosis not present

## 2024-03-25 LAB — CMP (CANCER CENTER ONLY)
ALT: 45 U/L — ABNORMAL HIGH (ref 0–44)
AST: 40 U/L (ref 15–41)
Albumin: 4.3 g/dL (ref 3.5–5.0)
Alkaline Phosphatase: 93 U/L (ref 38–126)
Anion gap: 9 (ref 5–15)
BUN: 14 mg/dL (ref 6–20)
CO2: 25 mmol/L (ref 22–32)
Calcium: 9.9 mg/dL (ref 8.9–10.3)
Chloride: 105 mmol/L (ref 98–111)
Creatinine: 0.67 mg/dL (ref 0.44–1.00)
GFR, Estimated: >60 mL/min (ref >60)
Glucose, Bld: 89 mg/dL (ref 70–99)
Potassium: 4.1 mmol/L (ref 3.5–5.1)
Sodium: 139 mmol/L (ref 135–145)
Total Bilirubin: 0.3 mg/dL (ref 0.0–1.2)
Total Protein: 7.2 g/dL (ref 6.5–8.1)

## 2024-03-25 LAB — CBC WITH DIFFERENTIAL (CANCER CENTER ONLY)
Abs Immature Granulocytes: 0.16 K/uL — ABNORMAL HIGH (ref 0.00–0.07)
Basophils Absolute: 0 K/uL (ref 0.0–0.1)
Basophils Relative: 1 %
Eosinophils Absolute: 0.1 K/uL (ref 0.0–0.5)
Eosinophils Relative: 2 %
HCT: 30.9 % — ABNORMAL LOW (ref 36.0–46.0)
Hemoglobin: 10.2 g/dL — ABNORMAL LOW (ref 12.0–15.0)
Immature Granulocytes: 2 %
Lymphocytes Relative: 24 %
Lymphs Abs: 1.6 K/uL (ref 0.7–4.0)
MCH: 26.8 pg (ref 26.0–34.0)
MCHC: 33 g/dL (ref 30.0–36.0)
MCV: 81.3 fL (ref 80.0–100.0)
Monocytes Absolute: 1.2 K/uL — ABNORMAL HIGH (ref 0.1–1.0)
Monocytes Relative: 17 %
Neutro Abs: 3.6 K/uL (ref 1.7–7.7)
Neutrophils Relative %: 54 %
Platelet Count: 263 K/uL (ref 150–400)
RBC: 3.8 MIL/uL — ABNORMAL LOW (ref 3.87–5.11)
RDW: 19.2 % — ABNORMAL HIGH (ref 11.5–15.5)
WBC Count: 6.7 K/uL (ref 4.0–10.5)
nRBC: 0 % (ref 0.0–0.2)

## 2024-03-25 LAB — CEA (ACCESS): CEA (CHCC): 6.02 ng/mL — ABNORMAL HIGH (ref 0.00–5.00)

## 2024-03-25 LAB — TOTAL PROTEIN, URINE DIPSTICK: Protein, ur: NEGATIVE mg/dL

## 2024-03-25 MED ORDER — DEXTROSE 5 % IV SOLN: INTRAVENOUS | Status: DC

## 2024-03-25 MED ORDER — DEXAMETHASONE SOD PHOSPHATE PF 10 MG/ML IJ SOLN
10.0000 mg | Freq: Once | INTRAMUSCULAR | Status: AC
  Administered 2024-03-25: 10:00:00 10 mg via INTRAVENOUS

## 2024-03-25 MED ORDER — SODIUM CHLORIDE 0.9 % IV SOLN: INTRAVENOUS | Status: DC

## 2024-03-25 MED ORDER — ATROPINE SULFATE 1 MG/ML IV SOLN
0.5000 mg | Freq: Once | INTRAVENOUS | Status: AC | PRN
  Administered 2024-03-25: 11:00:00 0.5 mg via INTRAVENOUS
  Filled 2024-03-25: qty 1

## 2024-03-25 MED ORDER — PALONOSETRON HCL INJECTION 0.25 MG/5ML
0.2500 mg | Freq: Once | INTRAVENOUS | Status: AC
  Administered 2024-03-25: 10:00:00 0.25 mg via INTRAVENOUS
  Filled 2024-03-25: qty 5

## 2024-03-25 MED ORDER — LEUCOVORIN CALCIUM INJECTION 350 MG
200.0000 mg/m2 | Freq: Once | INTRAVENOUS | Status: AC
  Administered 2024-03-25: 12:00:00 356 mg via INTRAVENOUS
  Filled 2024-03-25: qty 17.8

## 2024-03-25 MED ORDER — APREPITANT 130 MG/18ML IV EMUL
130.0000 mg | Freq: Once | INTRAVENOUS | Status: AC
  Administered 2024-03-25: 10:00:00 130 mg via INTRAVENOUS
  Filled 2024-03-25: qty 18

## 2024-03-25 MED ORDER — OXALIPLATIN CHEMO INJECTION 100 MG/20ML
85.0000 mg/m2 | Freq: Once | INTRAVENOUS | Status: AC
  Administered 2024-03-25: 12:00:00 150 mg via INTRAVENOUS
  Filled 2024-03-25: qty 20

## 2024-03-25 MED ORDER — SODIUM CHLORIDE 0.9 % IV SOLN
2000.0000 mg/m2 | INTRAVENOUS | Status: DC
  Administered 2024-03-25: 15:00:00 3500 mg via INTRAVENOUS
  Filled 2024-03-25: qty 70

## 2024-03-25 MED ORDER — ALTEPLASE 2 MG IJ SOLR
2.0000 mg | Freq: Once | INTRAMUSCULAR | Status: AC | PRN
  Administered 2024-03-25: 08:00:00 2 mg
  Filled 2024-03-25: qty 2

## 2024-03-25 MED ORDER — SODIUM CHLORIDE 0.9 % IV SOLN
125.0000 mg/m2 | Freq: Once | INTRAVENOUS | Status: AC
  Administered 2024-03-25: 11:00:00 220 mg via INTRAVENOUS
  Filled 2024-03-25: qty 5

## 2024-03-25 NOTE — Patient Instructions (Signed)
 [facility] - A DEPT OF [facility]  Discharge Instructions: Thank you for choosing [facility] to provide your oncology and hematology care.   If you have a lab appointment with the Cancer Center, please go directly to the Cancer Center and check in at the registration area.   Wear comfortable clothing and clothing appropriate for easy access to any Portacath or PICC line.   We strive to give you quality time with your provider. You may need to reschedule your appointment if you arrive late (15 or more minutes).  Arriving late affects you and other patients whose appointments are after yours.  Also, if you miss three or more appointments without notifying the office, you may be dismissed from the clinic at the provider's discretion.      For prescription refill requests, have your pharmacy contact our office and allow 72 hours for refills to be completed.    Today you received the following chemotherapy and/or immunotherapy agents: irinotecan , oxaliplatin , leucovorin , fluorouracil        To help prevent nausea and vomiting after your treatment, we encourage you to take your nausea medication as directed.  BELOW ARE SYMPTOMS THAT SHOULD BE REPORTED IMMEDIATELY: *FEVER GREATER THAN 100.4 F (38 C) OR HIGHER *CHILLS OR SWEATING *NAUSEA AND VOMITING THAT IS NOT CONTROLLED WITH YOUR NAUSEA MEDICATION *UNUSUAL SHORTNESS OF BREATH *UNUSUAL BRUISING OR BLEEDING *URINARY PROBLEMS (pain or burning when urinating, or frequent urination) *BOWEL PROBLEMS (unusual diarrhea, constipation, pain near the anus) TENDERNESS IN MOUTH AND THROAT WITH OR WITHOUT PRESENCE OF ULCERS (sore throat, sores in mouth, or a toothache) UNUSUAL RASH, SWELLING OR PAIN  UNUSUAL VAGINAL DISCHARGE OR ITCHING   Items with * indicate a potential emergency and should be followed up as soon as possible or go to the Emergency Department if any problems should occur.  Please show the  CHEMOTHERAPY ALERT CARD or IMMUNOTHERAPY ALERT CARD at check-in to the Emergency Department and triage nurse.  Should you have questions after your visit or need to cancel or reschedule your appointment, please contact [facility] - A DEPT OF [facility]  Dept: [phone]  and follow the prompts.  Office hours are 8:00 a.m. to 4:30 p.m. Monday - Friday. Please note that voicemails left after 4:00 p.m. may not be returned until the following business day.  We are closed weekends and major holidays. You have access to a nurse at all times for urgent questions. Please call the main number to the clinic Dept: [phone] and follow the prompts.   For any non-urgent questions, you may also contact your provider using MyChart. We now offer e-Visits for anyone 80 and older to request care online for non-urgent symptoms. For details visit [email or web address].   Also download the MyChart app! Go to the app store, search MyChart, open the app, select [facility], and log in with your MyChart username and password.

## 2024-03-25 NOTE — Patient Instructions (Signed)

## 2024-03-25 NOTE — Progress Notes (Signed)
°  [facility] OFFICE PROGRESS NOTE   Diagnosis:  Colon cancer  INTERVAL HISTORY:   Abigail Hicks returns as scheduled.  She completed cycle 3 FOLFOXIRI 03/11/2024.  Nausea was significantly improved.  She thinks this was due to the dexamethasone  prophylaxis.  No vomiting.  No mouth sores.  No diarrhea.  Cold sensitivity lasted 7 to 10 days.  No persistent neuropathy symptoms.  Objective:  Vital signs in last 24 hours:  Blood pressure 102/73, pulse 72, temperature 98.1 F (36.7 C), temperature source Temporal, resp. rate 18, height 5' 8 (1.727 m), weight 147 lb 12.8 oz (67 kg), SpO2 100%.    HEENT: No thrush or ulcers. Resp: Lungs clear bilaterally. Cardio: Regular rate and rhythm. GI: No hepatosplenomegaly.  Nontender. Vascular: No leg edema. Neuro: Vibratory sense intact over the fingertips per tuning fork exam. Skin: Palms without erythema.   Lab Results:  Lab Results  Component Value Date   WBC 6.7 03/25/2024   HGB 10.2 (L) 03/25/2024   HCT 30.9 (L) 03/25/2024   MCV 81.3 03/25/2024   PLT 263 03/25/2024   NEUTROABS 3.6 03/25/2024    Imaging:  No results found.  Medications: I have reviewed the patient's current medications.  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) 01/17/2024 CT abdomen/pelvis: Large abdominopelvic cystic mass, arising from the right ovary, ascites, right hydroureteronephrosis to the level of the pelvic mass, left ovary cysts 01/26/2024 ultrasound pelvis: Complex solid and cystic right adnexal mass measuring 18.2 x 9.7 x 14.3 cm, complex left ovarian cyst, moderate free fluid 01/26/2024 CT abdomen/pelvis: Sigmoid colon mass, adjacent pathologic adenopathy, peritoneal carcinomatosis, bilateral ovarian masses suspicious for metastases, 10 mm indeterminate right middle lobe nodule 01/27/2024 CT chest: 2.9 cm rounded consolidation in the posterior right lower lobe suspicious for metastasis, scattered pulmonary nodules  measuring up to 9 mm, indeterminate right paratracheal node, new trace right and small left pleural effusions, moderate ascites 01/28/2024 colonoscopy: Partially obstructing tumor at 14 cm - 20 cm proximal to the anus, biopsy-moderately differentiated adenocarcinoma, CDX2 positive, mismatch repair protein expression normal; foundation 1-microsatellite stable, tumor mutation burden 2, KRAS A59del, PTEN loss Elevated CEA, CA125 Cycle 1 FOLFOXIRI 02/05/2024, G-CSF Cycle 2 FOLFOXIRI 02/19/2024, G-CSF Cycle 3 FOLFOXIRI 03/11/2024, G-CSF, Decadron  added for delayed nausea Cycle 4 FOLFOXIRI 03/25/2024, G-CSF, Decadron  for delayed nausea   2.  Constipation and abdominal/pelvic distention secondary to #1 3.  Anemia/thrombocytosis secondary #1 4.  Asthma  Disposition: Abigail Hicks appears stable.  She has completed 3 cycles of FOLFOXIRI.  She continues to tolerate treatment well.  There is no clinical evidence of disease progression.  Most recent CEA was improved.  She tolerated cycle 3 well with significant improvement in delayed nausea.  She will continue dexamethasone  prophylaxis.  Plan to proceed with cycle 4 today as scheduled.  CBC and chemistry panel reviewed.  Labs are adequate for treatment.  She will return for follow-up and the next cycle of chemotherapy in 4 weeks due to a planned vacation.    Olam Ned ANP/GNP-BC   03/25/2024  9:14 AM

## 2024-03-25 NOTE — Progress Notes (Signed)
 Patient seen by Olam Ned NP today  Vitals are within treatment parameters:Yes   Labs are within treatment parameters: Yes   Treatment plan has been signed: Yes   Per physician team, Patient is ready for treatment and there are NO modifications to the treatment plan.

## 2024-03-27 ENCOUNTER — Inpatient Hospital Stay

## 2024-03-27 ENCOUNTER — Other Ambulatory Visit: Payer: Self-pay

## 2024-03-27 VITALS — BP 105/78 | HR 81 | Temp 98.1°F | Resp 16

## 2024-03-27 DIAGNOSIS — C187 Malignant neoplasm of sigmoid colon: Secondary | ICD-10-CM

## 2024-03-27 DIAGNOSIS — Z5111 Encounter for antineoplastic chemotherapy: Secondary | ICD-10-CM | POA: Diagnosis not present

## 2024-03-27 MED ORDER — PEGFILGRASTIM-JMDB 6 MG/0.6ML ~~LOC~~ SOSY
6.0000 mg | PREFILLED_SYRINGE | Freq: Once | SUBCUTANEOUS | Status: AC
  Administered 2024-03-27: 6 mg via SUBCUTANEOUS
  Filled 2024-03-27: qty 0.6

## 2024-03-27 NOTE — Patient Instructions (Signed)

## 2024-03-30 ENCOUNTER — Other Ambulatory Visit: Payer: Self-pay | Admitting: Oncology

## 2024-03-30 ENCOUNTER — Other Ambulatory Visit: Payer: Self-pay

## 2024-03-30 ENCOUNTER — Other Ambulatory Visit (HOSPITAL_COMMUNITY): Payer: Self-pay

## 2024-03-30 ENCOUNTER — Other Ambulatory Visit (HOSPITAL_BASED_OUTPATIENT_CLINIC_OR_DEPARTMENT_OTHER): Payer: Self-pay

## 2024-03-30 MED ORDER — ONDANSETRON 8 MG PO TBDP
8.0000 mg | ORAL_TABLET | Freq: Three times a day (TID) | ORAL | 0 refills | Status: DC | PRN
  Filled 2024-03-30 (×2): qty 18, 6d supply, fill #0

## 2024-03-31 ENCOUNTER — Other Ambulatory Visit (HOSPITAL_COMMUNITY): Payer: Self-pay

## 2024-03-31 ENCOUNTER — Inpatient Hospital Stay

## 2024-03-31 ENCOUNTER — Other Ambulatory Visit: Payer: Self-pay | Admitting: *Deleted

## 2024-03-31 DIAGNOSIS — C187 Malignant neoplasm of sigmoid colon: Secondary | ICD-10-CM

## 2024-03-31 DIAGNOSIS — Z5111 Encounter for antineoplastic chemotherapy: Secondary | ICD-10-CM | POA: Diagnosis not present

## 2024-03-31 MED ORDER — SODIUM CHLORIDE 0.9 % IV SOLN: INTRAVENOUS | Status: AC

## 2024-03-31 NOTE — Patient Instructions (Signed)

## 2024-04-15 ENCOUNTER — Inpatient Hospital Stay

## 2024-04-15 ENCOUNTER — Inpatient Hospital Stay: Admitting: Nurse Practitioner

## 2024-04-17 ENCOUNTER — Inpatient Hospital Stay

## 2024-04-18 ENCOUNTER — Other Ambulatory Visit: Payer: Self-pay | Admitting: Oncology

## 2024-04-22 ENCOUNTER — Encounter: Payer: Self-pay | Admitting: Nurse Practitioner

## 2024-04-22 ENCOUNTER — Inpatient Hospital Stay (HOSPITAL_BASED_OUTPATIENT_CLINIC_OR_DEPARTMENT_OTHER): Admitting: Nurse Practitioner

## 2024-04-22 ENCOUNTER — Inpatient Hospital Stay

## 2024-04-22 ENCOUNTER — Telehealth: Payer: Self-pay | Admitting: *Deleted

## 2024-04-22 ENCOUNTER — Inpatient Hospital Stay: Attending: Gynecologic Oncology

## 2024-04-22 ENCOUNTER — Other Ambulatory Visit: Payer: Self-pay | Admitting: *Deleted

## 2024-04-22 VITALS — BP 117/82 | HR 83 | Temp 98.1°F | Resp 18 | Ht 68.0 in | Wt 149.4 lb

## 2024-04-22 VITALS — BP 112/68 | HR 74 | Temp 97.9°F | Resp 14

## 2024-04-22 DIAGNOSIS — Z5189 Encounter for other specified aftercare: Secondary | ICD-10-CM | POA: Diagnosis not present

## 2024-04-22 DIAGNOSIS — C187 Malignant neoplasm of sigmoid colon: Secondary | ICD-10-CM | POA: Insufficient documentation

## 2024-04-22 DIAGNOSIS — D630 Anemia in neoplastic disease: Secondary | ICD-10-CM | POA: Insufficient documentation

## 2024-04-22 DIAGNOSIS — J45909 Unspecified asthma, uncomplicated: Secondary | ICD-10-CM | POA: Diagnosis not present

## 2024-04-22 DIAGNOSIS — G629 Polyneuropathy, unspecified: Secondary | ICD-10-CM | POA: Insufficient documentation

## 2024-04-22 DIAGNOSIS — C786 Secondary malignant neoplasm of retroperitoneum and peritoneum: Secondary | ICD-10-CM | POA: Insufficient documentation

## 2024-04-22 DIAGNOSIS — Z452 Encounter for adjustment and management of vascular access device: Secondary | ICD-10-CM | POA: Insufficient documentation

## 2024-04-22 DIAGNOSIS — Z5111 Encounter for antineoplastic chemotherapy: Secondary | ICD-10-CM | POA: Diagnosis present

## 2024-04-22 DIAGNOSIS — K5900 Constipation, unspecified: Secondary | ICD-10-CM | POA: Diagnosis not present

## 2024-04-22 DIAGNOSIS — D75838 Other thrombocytosis: Secondary | ICD-10-CM | POA: Diagnosis not present

## 2024-04-22 LAB — CBC WITH DIFFERENTIAL (CANCER CENTER ONLY)
Abs Immature Granulocytes: 0.01 K/uL (ref 0.00–0.07)
Basophils Absolute: 0 K/uL (ref 0.0–0.1)
Basophils Relative: 0 %
Eosinophils Absolute: 0.1 K/uL (ref 0.0–0.5)
Eosinophils Relative: 1 %
HCT: 30.4 % — ABNORMAL LOW (ref 36.0–46.0)
Hemoglobin: 9.7 g/dL — ABNORMAL LOW (ref 12.0–15.0)
Immature Granulocytes: 0 %
Lymphocytes Relative: 28 %
Lymphs Abs: 1.1 K/uL (ref 0.7–4.0)
MCH: 27.7 pg (ref 26.0–34.0)
MCHC: 31.9 g/dL (ref 30.0–36.0)
MCV: 86.9 fL (ref 80.0–100.0)
Monocytes Absolute: 0.6 K/uL (ref 0.1–1.0)
Monocytes Relative: 14 %
Neutro Abs: 2.2 K/uL (ref 1.7–7.7)
Neutrophils Relative %: 57 %
Platelet Count: 265 K/uL (ref 150–400)
RBC: 3.5 MIL/uL — ABNORMAL LOW (ref 3.87–5.11)
RDW: 17.6 % — ABNORMAL HIGH (ref 11.5–15.5)
WBC Count: 3.9 K/uL — ABNORMAL LOW (ref 4.0–10.5)
nRBC: 0 % (ref 0.0–0.2)

## 2024-04-22 LAB — CMP (CANCER CENTER ONLY)
ALT: 12 U/L (ref 0–44)
AST: 19 U/L (ref 15–41)
Albumin: 4.1 g/dL (ref 3.5–5.0)
Alkaline Phosphatase: 54 U/L (ref 38–126)
Anion gap: 10 (ref 5–15)
BUN: 10 mg/dL (ref 6–20)
CO2: 24 mmol/L (ref 22–32)
Calcium: 9.5 mg/dL (ref 8.9–10.3)
Chloride: 105 mmol/L (ref 98–111)
Creatinine: 0.72 mg/dL (ref 0.44–1.00)
GFR, Estimated: >60 mL/min
Glucose, Bld: 91 mg/dL (ref 70–99)
Potassium: 3.9 mmol/L (ref 3.5–5.1)
Sodium: 139 mmol/L (ref 135–145)
Total Bilirubin: 0.5 mg/dL (ref 0.0–1.2)
Total Protein: 6.8 g/dL (ref 6.5–8.1)

## 2024-04-22 LAB — CEA (ACCESS): CEA (CHCC): 6.98 ng/mL — ABNORMAL HIGH (ref 0.00–5.00)

## 2024-04-22 LAB — PREGNANCY, URINE: Preg Test, Ur: NEGATIVE

## 2024-04-22 MED ORDER — DEXAMETHASONE SOD PHOSPHATE PF 10 MG/ML IJ SOLN
10.0000 mg | Freq: Once | INTRAMUSCULAR | Status: AC
  Administered 2024-04-22: 10 mg via INTRAVENOUS
  Filled 2024-04-22: qty 1

## 2024-04-22 MED ORDER — SODIUM CHLORIDE 0.9 % IV SOLN
2000.0000 mg/m2 | INTRAVENOUS | Status: DC
  Administered 2024-04-22: 3500 mg via INTRAVENOUS
  Filled 2024-04-22: qty 70

## 2024-04-22 MED ORDER — APREPITANT 130 MG/18ML IV EMUL
130.0000 mg | Freq: Once | INTRAVENOUS | Status: AC
  Administered 2024-04-22: 130 mg via INTRAVENOUS
  Filled 2024-04-22: qty 18

## 2024-04-22 MED ORDER — SODIUM CHLORIDE 0.9 % IV SOLN: INTRAVENOUS | Status: DC

## 2024-04-22 MED ORDER — SODIUM CHLORIDE 0.9 % IV SOLN
125.0000 mg/m2 | Freq: Once | INTRAVENOUS | Status: AC
  Administered 2024-04-22: 220 mg via INTRAVENOUS
  Filled 2024-04-22: qty 5

## 2024-04-22 MED ORDER — PALONOSETRON HCL INJECTION 0.25 MG/5ML
0.2500 mg | Freq: Once | INTRAVENOUS | Status: AC
  Administered 2024-04-22: 0.25 mg via INTRAVENOUS
  Filled 2024-04-22: qty 5

## 2024-04-22 MED ORDER — LEUCOVORIN CALCIUM INJECTION 350 MG
200.0000 mg/m2 | Freq: Once | INTRAVENOUS | Status: DC
  Filled 2024-04-22: qty 17.8

## 2024-04-22 MED ORDER — SODIUM CHLORIDE 0.9 % IV SOLN
200.0000 mg/m2 | Freq: Once | INTRAVENOUS | Status: AC
  Administered 2024-04-22: 356 mg via INTRAVENOUS
  Filled 2024-04-22: qty 17.8

## 2024-04-22 MED ORDER — ATROPINE SULFATE 1 MG/ML IV SOLN
0.5000 mg | Freq: Once | INTRAVENOUS | Status: AC | PRN
  Administered 2024-04-22: 0.5 mg via INTRAVENOUS
  Filled 2024-04-22: qty 1

## 2024-04-22 NOTE — Telephone Encounter (Signed)
 done

## 2024-04-22 NOTE — Progress Notes (Signed)
 Patient seen by Olam Ned NP today  Vitals are within treatment parameters:Yes   Labs are within treatment parameters: Yes   Treatment plan has been signed: Yes   Per physician team, Patient is ready for treatment. Please note the following modifications:   Changes made within treatment  plan per Lisa/NP

## 2024-04-22 NOTE — Patient Instructions (Signed)
 [facility] - A DEPT OF [place]  Discharge Instructions: Thank you for choosing [facility] to provide your oncology and hematology care.   If you have a lab appointment with the Cancer Center, please go directly to the Cancer Center and check in at the registration area.   Wear comfortable clothing and clothing appropriate for easy access to any Portacath or PICC line.   We strive to give you quality time with your provider. You may need to reschedule your appointment if you arrive late (15 or more minutes).  Arriving late affects you and other patients whose appointments are after yours.  Also, if you miss three or more appointments without notifying the office, you may be dismissed from the clinic at the provider's discretion.      For prescription refill requests, have your pharmacy contact our office and allow 72 hours for refills to be completed.    Today you received the following chemotherapy and/or immunotherapy agents: irinotecan , leucovorin , fluorouracil           To help prevent nausea and vomiting after your treatment, we encourage you to take your nausea medication as directed.  BELOW ARE SYMPTOMS THAT SHOULD BE REPORTED IMMEDIATELY: *FEVER GREATER THAN 100.4 F (38 C) OR HIGHER *CHILLS OR SWEATING *NAUSEA AND VOMITING THAT IS NOT CONTROLLED WITH YOUR NAUSEA MEDICATION *UNUSUAL SHORTNESS OF BREATH *UNUSUAL BRUISING OR BLEEDING *URINARY PROBLEMS (pain or burning when urinating, or frequent urination) *BOWEL PROBLEMS (unusual diarrhea, constipation, pain near the anus) TENDERNESS IN MOUTH AND THROAT WITH OR WITHOUT PRESENCE OF ULCERS (sore throat, sores in mouth, or a toothache) UNUSUAL RASH, SWELLING OR PAIN  UNUSUAL VAGINAL DISCHARGE OR ITCHING   Items with * indicate a potential emergency and should be followed up as soon as possible or go to the Emergency Department if any problems should occur.  Please show the  CHEMOTHERAPY ALERT CARD or IMMUNOTHERAPY ALERT CARD at check-in to the Emergency Department and triage nurse.  Should you have questions after your visit or need to cancel or reschedule your appointment, please contact [facility] - A DEPT OF [facility]  Dept: [phone]  and follow the prompts.  Office hours are 8:00 a.m. to 4:30 p.m. Monday - Friday. Please note that voicemails left after 4:00 p.m. may not be returned until the following business day.  We are closed weekends and major holidays. You have access to a nurse at all times for urgent questions. Please call the main number to the clinic Dept: [phone] and follow the prompts.   For any non-urgent questions, you may also contact your provider using MyChart. We now offer e-Visits for anyone 26 and older to request care online for non-urgent symptoms. For details visit [email or web address].   Also download the MyChart app! Go to the app store, search MyChart, open the app, select [place], and log in with your MyChart username and password.

## 2024-04-22 NOTE — Progress Notes (Signed)
 Per Olam Ned, NP, ok to infuse Irinotecan  + Leucovorin  concurrently over 90 min (similar to FOLFIRI) since pt is not getting Oxaliplatin  today.  Deandrew Hoecker, Pharm.D., CPP 04/22/2024@9 :03 AM

## 2024-04-22 NOTE — Patient Instructions (Signed)

## 2024-04-22 NOTE — Progress Notes (Signed)
" °  [facility] OFFICE PROGRESS NOTE   Diagnosis:  Colon cancer  INTERVAL HISTORY:   Abigail Hicks returns as scheduled.  She completed cycle 4 FOLFOXIRI 03/25/2024.  She requested treatment be held 2 weeks ago due to a vacation.  She had a single episode of nausea/vomiting about 1 week after the last treatment.  No mouth sores.  No diarrhea.  No hand or foot pain or redness.  Cold sensitivity lasted about 1 week.  She notes over the past 2 weeks she has been dropping things.  Objective:  Vital signs in last 24 hours:  Blood pressure 117/82, pulse 83, temperature 98.1 F (36.7 C), temperature source Temporal, resp. rate 18, height 5' 8 (1.727 m), weight 149 lb 6.4 oz (67.8 kg), SpO2 100%.    HEENT: No thrush or ulcers. Resp: Lungs clear bilaterally. Cardio: Regular rate and rhythm. GI: No hepatosplenomegaly.  Nontender. Vascular: No leg edema. Neuro: Vibratory sense intact over the fingertips for tuning fork exam. Skin: Palms without erythema. Port-A-Cath without erythema.  Lab Results:  Lab Results  Component Value Date   WBC 3.9 (L) 04/22/2024   HGB 9.7 (L) 04/22/2024   HCT 30.4 (L) 04/22/2024   MCV 86.9 04/22/2024   PLT 265 04/22/2024   NEUTROABS 2.2 04/22/2024    Imaging:  No results found.  Medications: I have reviewed the patient's current medications.  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) 01/17/2024 CT abdomen/pelvis: Large abdominopelvic cystic mass, arising from the right ovary, ascites, right hydroureteronephrosis to the level of the pelvic mass, left ovary cysts 01/26/2024 ultrasound pelvis: Complex solid and cystic right adnexal mass measuring 18.2 x 9.7 x 14.3 cm, complex left ovarian cyst, moderate free fluid 01/26/2024 CT abdomen/pelvis: Sigmoid colon mass, adjacent pathologic adenopathy, peritoneal carcinomatosis, bilateral ovarian masses suspicious for metastases, 10 mm indeterminate right middle lobe  nodule 01/27/2024 CT chest: 2.9 cm rounded consolidation in the posterior right lower lobe suspicious for metastasis, scattered pulmonary nodules measuring up to 9 mm, indeterminate right paratracheal node, new trace right and small left pleural effusions, moderate ascites 01/28/2024 colonoscopy: Partially obstructing tumor at 14 cm - 20 cm proximal to the anus, biopsy-moderately differentiated adenocarcinoma, CDX2 positive, mismatch repair protein expression normal; foundation 1-microsatellite stable, tumor mutation burden 2, KRAS A59del, PTEN loss Elevated CEA, CA125 Cycle 1 FOLFOXIRI 02/05/2024, G-CSF Cycle 2 FOLFOXIRI 02/19/2024, G-CSF Cycle 3 FOLFOXIRI 03/11/2024, G-CSF, Decadron  added for delayed nausea Cycle 4 FOLFOXIRI 03/25/2024, G-CSF, Decadron  for delayed nausea Cycle 5 FOLFOXIRI 04/22/2024, 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  Constipation and abdominal/pelvic distention secondary to #1 3.  Anemia/thrombocytosis secondary #1 4.  Asthma  Disposition: Abigail Hicks appears stable.  She has completed 4 cycles of FOLFOXIRI.  Overall tolerating treatment well.  Recently noted she is having difficulty holding onto objects, has dropped multiple items.  We decided to hold oxaliplatin  today.  Plan to proceed with the remainder of treatment as scheduled.  Restaging CTs prior to next office visit.  CBC and chemistry panel reviewed.  Labs adequate for treatment.  She will return for follow-up and the next cycle of chemotherapy in 2 weeks.  We are available to see her sooner if needed.    Olam Ned ANP/GNP-BC   04/22/2024  8:44 AM        "

## 2024-04-23 ENCOUNTER — Encounter (HOSPITAL_BASED_OUTPATIENT_CLINIC_OR_DEPARTMENT_OTHER): Payer: Self-pay

## 2024-04-23 ENCOUNTER — Telehealth: Payer: Self-pay | Admitting: *Deleted

## 2024-04-24 ENCOUNTER — Inpatient Hospital Stay

## 2024-04-24 ENCOUNTER — Other Ambulatory Visit: Payer: Self-pay

## 2024-04-24 VITALS — BP 114/70 | HR 72 | Temp 97.7°F | Resp 16

## 2024-04-24 DIAGNOSIS — C187 Malignant neoplasm of sigmoid colon: Secondary | ICD-10-CM

## 2024-04-24 DIAGNOSIS — Z5111 Encounter for antineoplastic chemotherapy: Secondary | ICD-10-CM | POA: Diagnosis not present

## 2024-04-24 MED ORDER — PEGFILGRASTIM-JMDB 6 MG/0.6ML ~~LOC~~ SOSY
6.0000 mg | PREFILLED_SYRINGE | Freq: Once | SUBCUTANEOUS | Status: AC
  Administered 2024-04-24: 6 mg via SUBCUTANEOUS
  Filled 2024-04-24: qty 0.6

## 2024-04-24 NOTE — Patient Instructions (Signed)

## 2024-04-25 ENCOUNTER — Ambulatory Visit (HOSPITAL_BASED_OUTPATIENT_CLINIC_OR_DEPARTMENT_OTHER)
Admission: RE | Admit: 2024-04-25 | Discharge: 2024-04-25 | Disposition: A | Source: Ambulatory Visit | Attending: Nurse Practitioner | Admitting: Nurse Practitioner

## 2024-04-25 ENCOUNTER — Ambulatory Visit (HOSPITAL_BASED_OUTPATIENT_CLINIC_OR_DEPARTMENT_OTHER)

## 2024-04-25 ENCOUNTER — Other Ambulatory Visit: Payer: Self-pay

## 2024-04-25 DIAGNOSIS — C187 Malignant neoplasm of sigmoid colon: Secondary | ICD-10-CM | POA: Diagnosis present

## 2024-04-25 MED ORDER — IOHEXOL 300 MG/ML  SOLN
100.0000 mL | Freq: Once | INTRAMUSCULAR | Status: AC | PRN
  Administered 2024-04-25: 100 mL via INTRAVENOUS

## 2024-04-25 MED ORDER — IOHEXOL 9 MG/ML PO SOLN: 500.0000 mL | ORAL | Status: AC

## 2024-04-29 ENCOUNTER — Encounter: Payer: Self-pay | Admitting: Oncology

## 2024-04-29 NOTE — Telephone Encounter (Signed)
 Open in error

## 2024-05-03 ENCOUNTER — Other Ambulatory Visit: Payer: Self-pay | Admitting: Oncology

## 2024-05-04 ENCOUNTER — Telehealth: Payer: Self-pay

## 2024-05-04 NOTE — Telephone Encounter (Signed)
 Spoke with patient directly and advised her of new appointment time on Tuesday 05/05/2024 starting at 1000. She was agreeable and verbalized understanding.

## 2024-05-05 ENCOUNTER — Inpatient Hospital Stay

## 2024-05-05 ENCOUNTER — Encounter: Payer: Self-pay | Admitting: Oncology

## 2024-05-05 ENCOUNTER — Inpatient Hospital Stay: Admitting: Oncology

## 2024-05-05 ENCOUNTER — Other Ambulatory Visit (HOSPITAL_BASED_OUTPATIENT_CLINIC_OR_DEPARTMENT_OTHER): Payer: Self-pay

## 2024-05-05 ENCOUNTER — Other Ambulatory Visit: Payer: Self-pay | Admitting: *Deleted

## 2024-05-05 VITALS — BP 120/89 | HR 80 | Temp 97.9°F | Resp 18 | Ht 68.0 in | Wt 148.8 lb

## 2024-05-05 VITALS — BP 131/78 | HR 72 | Temp 98.2°F | Resp 16

## 2024-05-05 DIAGNOSIS — C187 Malignant neoplasm of sigmoid colon: Secondary | ICD-10-CM

## 2024-05-05 DIAGNOSIS — Z5111 Encounter for antineoplastic chemotherapy: Secondary | ICD-10-CM | POA: Diagnosis not present

## 2024-05-05 LAB — CBC WITH DIFFERENTIAL (CANCER CENTER ONLY)
Abs Immature Granulocytes: 0.1 10*3/uL — ABNORMAL HIGH (ref 0.00–0.07)
Basophils Absolute: 0 10*3/uL (ref 0.0–0.1)
Basophils Relative: 0 %
Eosinophils Absolute: 0.1 10*3/uL (ref 0.0–0.5)
Eosinophils Relative: 1 %
HCT: 35.5 % — ABNORMAL LOW (ref 36.0–46.0)
Hemoglobin: 11.4 g/dL — ABNORMAL LOW (ref 12.0–15.0)
Immature Granulocytes: 1 %
Lymphocytes Relative: 20 %
Lymphs Abs: 1.9 10*3/uL (ref 0.7–4.0)
MCH: 28.6 pg (ref 26.0–34.0)
MCHC: 32.1 g/dL (ref 30.0–36.0)
MCV: 89.2 fL (ref 80.0–100.0)
Monocytes Absolute: 0.7 10*3/uL (ref 0.1–1.0)
Monocytes Relative: 7 %
Neutro Abs: 6.7 10*3/uL (ref 1.7–7.7)
Neutrophils Relative %: 71 %
Platelet Count: 212 10*3/uL (ref 150–400)
RBC: 3.98 MIL/uL (ref 3.87–5.11)
RDW: 15.9 % — ABNORMAL HIGH (ref 11.5–15.5)
WBC Count: 9.6 10*3/uL (ref 4.0–10.5)
nRBC: 0 % (ref 0.0–0.2)

## 2024-05-05 LAB — CMP (CANCER CENTER ONLY)
ALT: 17 U/L (ref 0–44)
AST: 16 U/L (ref 15–41)
Albumin: 4.4 g/dL (ref 3.5–5.0)
Alkaline Phosphatase: 107 U/L (ref 38–126)
Anion gap: 11 (ref 5–15)
BUN: 16 mg/dL (ref 6–20)
CO2: 25 mmol/L (ref 22–32)
Calcium: 9.9 mg/dL (ref 8.9–10.3)
Chloride: 103 mmol/L (ref 98–111)
Creatinine: 0.72 mg/dL (ref 0.44–1.00)
GFR, Estimated: >60 mL/min
Glucose, Bld: 89 mg/dL (ref 70–99)
Potassium: 4.1 mmol/L (ref 3.5–5.1)
Sodium: 138 mmol/L (ref 135–145)
Total Bilirubin: 0.3 mg/dL (ref 0.0–1.2)
Total Protein: 7.4 g/dL (ref 6.5–8.1)

## 2024-05-05 LAB — TOTAL PROTEIN, URINE DIPSTICK: Protein, ur: NEGATIVE mg/dL

## 2024-05-05 LAB — CEA (ACCESS): CEA (CHCC): 5.8 ng/mL — ABNORMAL HIGH (ref 0.00–5.00)

## 2024-05-05 MED ORDER — DEXTROSE 5 % IV SOLN: INTRAVENOUS | Status: DC

## 2024-05-05 MED ORDER — SODIUM CHLORIDE 0.9 % IV SOLN
2000.0000 mg/m2 | INTRAVENOUS | Status: DC
  Administered 2024-05-05: 3500 mg via INTRAVENOUS
  Filled 2024-05-05: qty 70

## 2024-05-05 MED ORDER — SORBITOL 70 % SOLN
15.0000 mL | Freq: Two times a day (BID) | 1 refills | Status: AC
  Filled 2024-05-05: qty 473, 16d supply, fill #0

## 2024-05-05 MED ORDER — OXALIPLATIN CHEMO INJECTION 100 MG/20ML
85.0000 mg/m2 | Freq: Once | INTRAVENOUS | Status: AC
  Administered 2024-05-05: 150 mg via INTRAVENOUS
  Filled 2024-05-05: qty 20

## 2024-05-05 MED ORDER — ATROPINE SULFATE 1 MG/ML IV SOLN
0.5000 mg | Freq: Once | INTRAVENOUS | Status: AC | PRN
  Administered 2024-05-05: 0.5 mg via INTRAVENOUS
  Filled 2024-05-05: qty 1

## 2024-05-05 MED ORDER — SODIUM CHLORIDE 0.9 % IV SOLN: INTRAVENOUS | Status: DC

## 2024-05-05 MED ORDER — ONDANSETRON 8 MG PO TBDP
8.0000 mg | ORAL_TABLET | Freq: Three times a day (TID) | ORAL | 2 refills | Status: AC | PRN
  Filled 2024-05-05: qty 30, 10d supply, fill #0

## 2024-05-05 MED ORDER — PALONOSETRON HCL INJECTION 0.25 MG/5ML
0.2500 mg | Freq: Once | INTRAVENOUS | Status: AC
  Administered 2024-05-05: 0.25 mg via INTRAVENOUS
  Filled 2024-05-05: qty 5

## 2024-05-05 MED ORDER — DEXAMETHASONE SOD PHOSPHATE PF 10 MG/ML IJ SOLN
10.0000 mg | Freq: Once | INTRAMUSCULAR | Status: AC
  Administered 2024-05-05: 10 mg via INTRAVENOUS
  Filled 2024-05-05: qty 1

## 2024-05-05 MED ORDER — SODIUM CHLORIDE 0.9 % IV SOLN
125.0000 mg/m2 | Freq: Once | INTRAVENOUS | Status: AC
  Administered 2024-05-05: 220 mg via INTRAVENOUS
  Filled 2024-05-05: qty 11

## 2024-05-05 MED ORDER — APREPITANT 130 MG/18ML IV EMUL
130.0000 mg | Freq: Once | INTRAVENOUS | Status: AC
  Administered 2024-05-05: 130 mg via INTRAVENOUS
  Filled 2024-05-05: qty 18

## 2024-05-05 MED ORDER — LEUCOVORIN CALCIUM INJECTION 350 MG
200.0000 mg/m2 | Freq: Once | INTRAVENOUS | Status: AC
  Administered 2024-05-05: 356 mg via INTRAVENOUS
  Filled 2024-05-05: qty 17.8

## 2024-05-05 MED ORDER — DEXAMETHASONE 4 MG PO TABS
4.0000 mg | ORAL_TABLET | Freq: Two times a day (BID) | ORAL | 0 refills | Status: AC
  Filled 2024-05-05: qty 36, 18d supply, fill #0

## 2024-05-05 NOTE — Patient Instructions (Signed)
 [facility] - A DEPT OF [facility]  Discharge Instructions: Thank you for choosing [facility] to provide your oncology and hematology care.   If you have a lab appointment with the Cancer Center, please go directly to the Cancer Center and check in at the registration area.   Wear comfortable clothing and clothing appropriate for easy access to any Portacath or PICC line.   We strive to give you quality time with your provider. You may need to reschedule your appointment if you arrive late (15 or more minutes).  Arriving late affects you and other patients whose appointments are after yours.  Also, if you miss three or more appointments without notifying the office, you may be dismissed from the clinic at the provider's discretion.      For prescription refill requests, have your pharmacy contact our office and allow 72 hours for refills to be completed.    Today you received the following chemotherapy and/or immunotherapy agents: irinotecan , oxaliplatin , leucovorin , fluorouracil        To help prevent nausea and vomiting after your treatment, we encourage you to take your nausea medication as directed.  BELOW ARE SYMPTOMS THAT SHOULD BE REPORTED IMMEDIATELY: *FEVER GREATER THAN 100.4 F (38 C) OR HIGHER *CHILLS OR SWEATING *NAUSEA AND VOMITING THAT IS NOT CONTROLLED WITH YOUR NAUSEA MEDICATION *UNUSUAL SHORTNESS OF BREATH *UNUSUAL BRUISING OR BLEEDING *URINARY PROBLEMS (pain or burning when urinating, or frequent urination) *BOWEL PROBLEMS (unusual diarrhea, constipation, pain near the anus) TENDERNESS IN MOUTH AND THROAT WITH OR WITHOUT PRESENCE OF ULCERS (sore throat, sores in mouth, or a toothache) UNUSUAL RASH, SWELLING OR PAIN  UNUSUAL VAGINAL DISCHARGE OR ITCHING   Items with * indicate a potential emergency and should be followed up as soon as possible or go to the Emergency Department if any problems should occur.  Please show the  CHEMOTHERAPY ALERT CARD or IMMUNOTHERAPY ALERT CARD at check-in to the Emergency Department and triage nurse.  Should you have questions after your visit or need to cancel or reschedule your appointment, please contact [facility] - A DEPT OF [facility]  Dept: [phone]  and follow the prompts.  Office hours are 8:00 a.m. to 4:30 p.m. Monday - Friday. Please note that voicemails left after 4:00 p.m. may not be returned until the following business day.  We are closed weekends and major holidays. You have access to a nurse at all times for urgent questions. Please call the main number to the clinic Dept: [phone] and follow the prompts.   For any non-urgent questions, you may also contact your provider using MyChart. We now offer e-Visits for anyone 80 and older to request care online for non-urgent symptoms. For details visit [email or web address].   Also download the MyChart app! Go to the app store, search MyChart, open the app, select [facility], and log in with your MyChart username and password.

## 2024-05-05 NOTE — Progress Notes (Signed)
 " [facility] OFFICE PROGRESS NOTE   Diagnosis: Colon cancer  INTERVAL HISTORY:   Abigail Hicks completed another cycle of chemotherapy 04/22/2024.  Oxaliplatin  was held due to neuropathy symptoms.  She reports she is no longer dropping objects.  No numbness.  She has intermittent constipation and low abdominal cramping.  She has nausea following chemotherapy.  The nausea is relieved with ondansetron  and prochlorperazine .  Objective:  Vital signs in last 24 hours:  Blood pressure 120/89, pulse 80, temperature 97.9 F (36.6 C), temperature source Temporal, resp. rate 18, height 5' 8 (1.727 m), weight 148 lb 12.8 oz (67.5 kg), SpO2 100%.    HEENT: No thrush or ulcers Resp: Lungs clear bilaterally Cardio: Regular rate and rhythm GI: No hepatosplenomegaly, soft fullness in the mid low abdomen Vascular: No leg edema Neuro: The vibratory sense is intact at the fingertips bilaterally   Portacath/PICC-without erythema  Lab Results:  Lab Results  Component Value Date   WBC 9.6 05/05/2024   HGB 11.4 (L) 05/05/2024   HCT 35.5 (L) 05/05/2024   MCV 89.2 05/05/2024   PLT 212 05/05/2024   NEUTROABS 6.7 05/05/2024    CMP  Lab Results  Component Value Date   NA 138 05/05/2024   K 4.1 05/05/2024   CL 103 05/05/2024   CO2 25 05/05/2024   GLUCOSE 89 05/05/2024   BUN 16 05/05/2024   CREATININE 0.72 05/05/2024   CALCIUM  9.9 05/05/2024   PROT 7.4 05/05/2024   ALBUMIN 4.4 05/05/2024   AST 16 05/05/2024   ALT 17 05/05/2024   ALKPHOS 107 05/05/2024   BILITOT 0.3 05/05/2024   GFRNONAA >60 05/05/2024   GFRAA 109 04/09/2019    Lab Results  Component Value Date   CEA 5.80 (H) 05/05/2024   Medications: I have reviewed the patient's current medications.   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) 01/17/2024 CT abdomen/pelvis: Large abdominopelvic cystic mass, arising from the right ovary, ascites, right hydroureteronephrosis to the level of the  pelvic mass, left ovary cysts 01/26/2024 ultrasound pelvis: Complex solid and cystic right adnexal mass measuring 18.2 x 9.7 x 14.3 cm, complex left ovarian cyst, moderate free fluid 01/26/2024 CT abdomen/pelvis: Sigmoid colon mass, adjacent pathologic adenopathy, peritoneal carcinomatosis, bilateral ovarian masses suspicious for metastases, 10 mm indeterminate right middle lobe nodule 01/27/2024 CT chest: 2.9 cm rounded consolidation in the posterior right lower lobe suspicious for metastasis, scattered pulmonary nodules measuring up to 9 mm, indeterminate right paratracheal node, new trace right and small left pleural effusions, moderate ascites 01/28/2024 colonoscopy: Partially obstructing tumor at 14 cm - 20 cm proximal to the anus, biopsy-moderately differentiated adenocarcinoma, CDX2 positive, mismatch repair protein expression normal; foundation 1-microsatellite stable, tumor mutation burden 2, KRAS A59del, PTEN loss Elevated CEA, CA125 Cycle 1 FOLFOXIRI 02/05/2024, G-CSF Cycle 2 FOLFOXIRI 02/19/2024, G-CSF Cycle 3 FOLFOXIRI 03/11/2024, G-CSF, Decadron  added for delayed nausea Cycle 4 FOLFOXIRI 03/25/2024, G-CSF, Decadron  for delayed nausea Cycle 5 FOLFOXIRI 04/22/2024, 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  Constipation and abdominal/pelvic distention secondary to #1 3.  Anemia/thrombocytosis secondary #1 4.  Asthma    Disposition: [name] has completed 5 cycles of chemotherapy.  I reviewed the restaging CT findings and images with her.  There has been significant clinical and radiologic improvement.  The plan is to continue FOLFOXIRI.  Oxaliplatin  will be added back to  the chemotherapy regimen today.  She reports resolution of the neuropathy symptoms she reported 2 weeks ago.  We discussed the indication for adding bevacizumab.  I explained the expected benefit with the addition of bevacizumab to chemotherapy.  We reviewed toxicities associated with bevacizumab.  She declines bevacizumab.  Ms. Shor would like to switch chemotherapy to a 3-week schedule.  She will return for an office visit and chemotherapy in 3 weeks.  She will continue postchemotherapy Decadron  prophylaxis.  She requests intravenous fluids during the week following chemotherapy.  Arley Hof, MD  05/05/2024  11:25 AM   "

## 2024-05-05 NOTE — Patient Instructions (Signed)

## 2024-05-05 NOTE — Progress Notes (Signed)
 Patient seen by Dr. Arley Hof today  Vitals are within treatment parameters:Yes   Labs are within treatment parameters: Yes   Treatment plan has been signed: Yes   Per physician team, Patient is ready for treatment and there are NO modifications to the treatment plan.

## 2024-05-06 ENCOUNTER — Other Ambulatory Visit: Payer: Self-pay

## 2024-05-07 ENCOUNTER — Inpatient Hospital Stay

## 2024-05-07 ENCOUNTER — Other Ambulatory Visit (HOSPITAL_BASED_OUTPATIENT_CLINIC_OR_DEPARTMENT_OTHER)

## 2024-05-07 VITALS — BP 104/68 | HR 75 | Temp 98.0°F | Resp 16

## 2024-05-07 DIAGNOSIS — Z5111 Encounter for antineoplastic chemotherapy: Secondary | ICD-10-CM | POA: Diagnosis not present

## 2024-05-07 DIAGNOSIS — C187 Malignant neoplasm of sigmoid colon: Secondary | ICD-10-CM

## 2024-05-07 MED ORDER — PEGFILGRASTIM-JMDB 6 MG/0.6ML ~~LOC~~ SOSY
6.0000 mg | PREFILLED_SYRINGE | Freq: Once | SUBCUTANEOUS | Status: AC
  Administered 2024-05-07: 6 mg via SUBCUTANEOUS
  Filled 2024-05-07: qty 0.6

## 2024-05-07 NOTE — Patient Instructions (Signed)

## 2024-05-13 ENCOUNTER — Inpatient Hospital Stay: Attending: Gynecologic Oncology

## 2024-05-13 DIAGNOSIS — C187 Malignant neoplasm of sigmoid colon: Secondary | ICD-10-CM

## 2024-05-13 MED ORDER — SODIUM CHLORIDE 0.9 % IV SOLN: INTRAVENOUS | Status: DC

## 2024-05-13 NOTE — Patient Instructions (Signed)

## 2024-05-20 ENCOUNTER — Inpatient Hospital Stay

## 2024-05-20 ENCOUNTER — Inpatient Hospital Stay: Admitting: Nurse Practitioner

## 2024-05-22 ENCOUNTER — Inpatient Hospital Stay

## 2024-05-27 ENCOUNTER — Inpatient Hospital Stay: Admitting: Nurse Practitioner

## 2024-05-27 ENCOUNTER — Inpatient Hospital Stay

## 2024-05-29 ENCOUNTER — Inpatient Hospital Stay

## 2024-06-03 ENCOUNTER — Inpatient Hospital Stay

## 2024-06-03 ENCOUNTER — Inpatient Hospital Stay: Admitting: Nurse Practitioner

## 2024-06-05 ENCOUNTER — Inpatient Hospital Stay

## 2024-06-17 ENCOUNTER — Inpatient Hospital Stay: Admitting: Oncology

## 2024-06-17 ENCOUNTER — Inpatient Hospital Stay

## 2024-06-19 ENCOUNTER — Inpatient Hospital Stay

## 2024-06-24 ENCOUNTER — Inpatient Hospital Stay
